# Patient Record
Sex: Female | Born: 1965
Health system: Southern US, Community
[De-identification: ages and names within clinical notes are randomized; demographics above are authoritative.]

## PROBLEM LIST (undated history)

## (undated) DIAGNOSIS — J329 Chronic sinusitis, unspecified: Secondary | ICD-10-CM

## (undated) DIAGNOSIS — D219 Benign neoplasm of connective and other soft tissue, unspecified: Secondary | ICD-10-CM

## (undated) DIAGNOSIS — T8859XA Other complications of anesthesia, initial encounter: Secondary | ICD-10-CM

## (undated) DIAGNOSIS — I10 Essential (primary) hypertension: Secondary | ICD-10-CM

## (undated) DIAGNOSIS — T4145XA Adverse effect of unspecified anesthetic, initial encounter: Secondary | ICD-10-CM

## (undated) DIAGNOSIS — Z9889 Other specified postprocedural states: Secondary | ICD-10-CM

## (undated) HISTORY — PX: MYOMECTOMY: SHX85

## (undated) HISTORY — PX: DIAGNOSTIC LAPAROSCOPY: SUR761

## (undated) HISTORY — PX: ABDOMINAL HYSTERECTOMY: SHX81

---

## 1998-06-25 ENCOUNTER — Ambulatory Visit (HOSPITAL_COMMUNITY): Admission: RE | Admit: 1998-06-25 | Discharge: 1998-06-25 | Payer: Self-pay | Admitting: Obstetrics and Gynecology

## 1999-02-28 ENCOUNTER — Other Ambulatory Visit: Admission: RE | Admit: 1999-02-28 | Discharge: 1999-02-28 | Payer: Self-pay | Admitting: Obstetrics and Gynecology

## 1999-06-01 ENCOUNTER — Other Ambulatory Visit: Admission: RE | Admit: 1999-06-01 | Discharge: 1999-06-01 | Payer: Self-pay | Admitting: Obstetrics and Gynecology

## 1999-09-26 ENCOUNTER — Other Ambulatory Visit: Admission: RE | Admit: 1999-09-26 | Discharge: 1999-09-26 | Payer: Self-pay | Admitting: Obstetrics and Gynecology

## 1999-11-22 ENCOUNTER — Other Ambulatory Visit: Admission: RE | Admit: 1999-11-22 | Discharge: 1999-11-22 | Payer: Self-pay | Admitting: Obstetrics and Gynecology

## 1999-12-19 ENCOUNTER — Encounter (INDEPENDENT_AMBULATORY_CARE_PROVIDER_SITE_OTHER): Payer: Self-pay

## 1999-12-19 ENCOUNTER — Other Ambulatory Visit: Admission: RE | Admit: 1999-12-19 | Discharge: 1999-12-19 | Payer: Self-pay | Admitting: Obstetrics and Gynecology

## 2000-02-18 ENCOUNTER — Ambulatory Visit (HOSPITAL_COMMUNITY): Admission: RE | Admit: 2000-02-18 | Discharge: 2000-02-18 | Payer: Self-pay | Admitting: Obstetrics and Gynecology

## 2000-02-18 ENCOUNTER — Encounter (INDEPENDENT_AMBULATORY_CARE_PROVIDER_SITE_OTHER): Payer: Self-pay | Admitting: Specialist

## 2000-05-23 ENCOUNTER — Other Ambulatory Visit: Admission: RE | Admit: 2000-05-23 | Discharge: 2000-05-23 | Payer: Self-pay | Admitting: Obstetrics and Gynecology

## 2000-10-16 ENCOUNTER — Other Ambulatory Visit: Admission: RE | Admit: 2000-10-16 | Discharge: 2000-10-16 | Payer: Self-pay | Admitting: Obstetrics and Gynecology

## 2000-11-23 ENCOUNTER — Ambulatory Visit (HOSPITAL_COMMUNITY): Admission: RE | Admit: 2000-11-23 | Discharge: 2000-11-23 | Payer: Self-pay | Admitting: Obstetrics and Gynecology

## 2000-11-23 ENCOUNTER — Encounter (INDEPENDENT_AMBULATORY_CARE_PROVIDER_SITE_OTHER): Payer: Self-pay

## 2001-06-01 ENCOUNTER — Inpatient Hospital Stay (HOSPITAL_COMMUNITY): Admission: AD | Admit: 2001-06-01 | Discharge: 2001-06-01 | Payer: Self-pay | Admitting: Obstetrics and Gynecology

## 2001-07-02 ENCOUNTER — Ambulatory Visit (HOSPITAL_COMMUNITY): Admission: RE | Admit: 2001-07-02 | Discharge: 2001-07-02 | Payer: Self-pay | Admitting: Obstetrics and Gynecology

## 2002-01-03 ENCOUNTER — Inpatient Hospital Stay (HOSPITAL_COMMUNITY): Admission: AD | Admit: 2002-01-03 | Discharge: 2002-01-03 | Payer: Self-pay | Admitting: Obstetrics and Gynecology

## 2002-03-05 ENCOUNTER — Other Ambulatory Visit: Admission: RE | Admit: 2002-03-05 | Discharge: 2002-03-05 | Payer: Self-pay | Admitting: Obstetrics and Gynecology

## 2002-03-06 ENCOUNTER — Encounter: Payer: Self-pay | Admitting: Obstetrics and Gynecology

## 2002-03-06 ENCOUNTER — Ambulatory Visit (HOSPITAL_COMMUNITY): Admission: RE | Admit: 2002-03-06 | Discharge: 2002-03-06 | Payer: Self-pay | Admitting: Obstetrics and Gynecology

## 2002-04-09 ENCOUNTER — Inpatient Hospital Stay (HOSPITAL_COMMUNITY): Admission: AD | Admit: 2002-04-09 | Discharge: 2002-04-09 | Payer: Self-pay | Admitting: Obstetrics and Gynecology

## 2003-04-09 ENCOUNTER — Encounter: Payer: Self-pay | Admitting: Emergency Medicine

## 2003-04-09 ENCOUNTER — Emergency Department (HOSPITAL_COMMUNITY): Admission: EM | Admit: 2003-04-09 | Discharge: 2003-04-09 | Payer: Self-pay | Admitting: Emergency Medicine

## 2003-10-21 ENCOUNTER — Other Ambulatory Visit: Admission: RE | Admit: 2003-10-21 | Discharge: 2003-10-21 | Payer: Self-pay | Admitting: Obstetrics and Gynecology

## 2004-12-08 ENCOUNTER — Ambulatory Visit (HOSPITAL_BASED_OUTPATIENT_CLINIC_OR_DEPARTMENT_OTHER): Admission: RE | Admit: 2004-12-08 | Discharge: 2004-12-08 | Payer: Self-pay | Admitting: Otolaryngology

## 2004-12-08 ENCOUNTER — Ambulatory Visit: Payer: Self-pay | Admitting: Internal Medicine

## 2005-05-29 ENCOUNTER — Ambulatory Visit (HOSPITAL_COMMUNITY): Admission: RE | Admit: 2005-05-29 | Discharge: 2005-05-29 | Payer: Self-pay | Admitting: Specialist

## 2006-05-10 ENCOUNTER — Inpatient Hospital Stay (HOSPITAL_COMMUNITY): Admission: AD | Admit: 2006-05-10 | Discharge: 2006-05-13 | Payer: Self-pay | Admitting: Obstetrics and Gynecology

## 2007-04-13 ENCOUNTER — Emergency Department (HOSPITAL_COMMUNITY): Admission: EM | Admit: 2007-04-13 | Discharge: 2007-04-13 | Payer: Self-pay | Admitting: Emergency Medicine

## 2007-04-18 ENCOUNTER — Emergency Department (HOSPITAL_COMMUNITY): Admission: EM | Admit: 2007-04-18 | Discharge: 2007-04-18 | Payer: Self-pay | Admitting: Emergency Medicine

## 2008-03-06 ENCOUNTER — Emergency Department (HOSPITAL_COMMUNITY): Admission: EM | Admit: 2008-03-06 | Discharge: 2008-03-06 | Payer: Self-pay | Admitting: Emergency Medicine

## 2009-07-20 ENCOUNTER — Inpatient Hospital Stay (HOSPITAL_COMMUNITY): Admission: RE | Admit: 2009-07-20 | Discharge: 2009-07-23 | Payer: Self-pay | Admitting: Obstetrics and Gynecology

## 2009-10-05 ENCOUNTER — Emergency Department (HOSPITAL_COMMUNITY): Admission: EM | Admit: 2009-10-05 | Discharge: 2009-10-05 | Payer: Self-pay | Admitting: Emergency Medicine

## 2009-11-25 ENCOUNTER — Emergency Department (HOSPITAL_COMMUNITY): Admission: EM | Admit: 2009-11-25 | Discharge: 2009-11-25 | Payer: Self-pay | Admitting: Family Medicine

## 2009-11-26 ENCOUNTER — Emergency Department (HOSPITAL_COMMUNITY): Admission: EM | Admit: 2009-11-26 | Discharge: 2009-11-26 | Payer: Self-pay | Admitting: Emergency Medicine

## 2010-10-09 ENCOUNTER — Encounter: Payer: Self-pay | Admitting: Obstetrics and Gynecology

## 2010-10-10 ENCOUNTER — Encounter: Payer: Self-pay | Admitting: Obstetrics and Gynecology

## 2010-10-25 ENCOUNTER — Other Ambulatory Visit (HOSPITAL_COMMUNITY): Payer: Self-pay | Admitting: Obstetrics and Gynecology

## 2010-10-25 DIAGNOSIS — Z1231 Encounter for screening mammogram for malignant neoplasm of breast: Secondary | ICD-10-CM

## 2010-11-08 ENCOUNTER — Ambulatory Visit (HOSPITAL_COMMUNITY): Payer: 59

## 2010-11-17 ENCOUNTER — Ambulatory Visit (HOSPITAL_COMMUNITY)
Admission: RE | Admit: 2010-11-17 | Discharge: 2010-11-17 | Disposition: A | Discharge status: Home / Self Care | Payer: 59 | Source: Ambulatory Visit | Attending: Obstetrics and Gynecology | Admitting: Obstetrics and Gynecology

## 2010-11-17 DIAGNOSIS — Z1231 Encounter for screening mammogram for malignant neoplasm of breast: Secondary | ICD-10-CM | POA: Insufficient documentation

## 2010-12-19 ENCOUNTER — Inpatient Hospital Stay (INDEPENDENT_AMBULATORY_CARE_PROVIDER_SITE_OTHER)
Admission: RE | Admit: 2010-12-19 | Discharge: 2010-12-19 | Disposition: A | Discharge status: Home / Self Care | Payer: 59 | Source: Ambulatory Visit | Attending: Family Medicine | Admitting: Family Medicine

## 2010-12-19 DIAGNOSIS — J069 Acute upper respiratory infection, unspecified: Secondary | ICD-10-CM

## 2010-12-21 LAB — CBC
Hemoglobin: 12.8 g/dL (ref 12.0–15.0)
MCHC: 33.1 g/dL (ref 30.0–36.0)
RBC: 4.43 MIL/uL (ref 3.87–5.11)
WBC: 11 10*3/uL — ABNORMAL HIGH (ref 4.0–10.5)

## 2010-12-21 LAB — CCBB MATERNAL DONOR DRAW

## 2010-12-22 LAB — CBC
HCT: 40.6 % (ref 36.0–46.0)
Hemoglobin: 13.6 g/dL (ref 12.0–15.0)
MCV: 85.7 fL (ref 78.0–100.0)
Platelets: 142 10*3/uL — ABNORMAL LOW (ref 150–400)
WBC: 7.9 10*3/uL (ref 4.0–10.5)

## 2010-12-29 ENCOUNTER — Other Ambulatory Visit (HOSPITAL_COMMUNITY): Payer: Self-pay | Admitting: Internal Medicine

## 2010-12-29 DIAGNOSIS — R51 Headache: Secondary | ICD-10-CM

## 2010-12-29 DIAGNOSIS — R110 Nausea: Secondary | ICD-10-CM

## 2010-12-30 ENCOUNTER — Ambulatory Visit (HOSPITAL_COMMUNITY)
Admission: RE | Admit: 2010-12-30 | Discharge: 2010-12-30 | Disposition: A | Discharge status: Home / Self Care | Payer: 59 | Source: Ambulatory Visit | Attending: Internal Medicine | Admitting: Internal Medicine

## 2010-12-30 DIAGNOSIS — R51 Headache: Secondary | ICD-10-CM | POA: Insufficient documentation

## 2010-12-30 DIAGNOSIS — I10 Essential (primary) hypertension: Secondary | ICD-10-CM | POA: Insufficient documentation

## 2010-12-30 DIAGNOSIS — R110 Nausea: Secondary | ICD-10-CM

## 2011-02-03 NOTE — Procedures (Signed)
NAME:  Erika Tate, Erika Tate            ACCOUNT NO.:  [id number]   MEDICAL RECORD NO.:  [id number]          PATIENT TYPE:  OUT   LOCATION:  SLEEP CENTER                 FACILITY:  [facility]   PHYSICIAN:  Clinton D. Maple Hudson, M.D. DATE OF BIRTH:  March 20, 1966   DATE OF STUDY:  12/08/2004                              NOCTURNAL POLYSOMNOGRAM   REFERRING PHYSICIAN:  Dr. Dillard Cannon.   INDICATION FOR STUDY:  Hypersomnia with sleep apnea. Epworth sleepiness  score 10/24. BMI 29, weight 160 pounds.   SLEEP ARCHITECTURE:  Total sleep time 366 minutes with sleep efficiency 85%.  Stage 1 was 6%, stage 2 73%, stages 3 and 4 were 5%, REM was 16% of total  sleep time. Latency to sleep onset 38 minutes, latency to REM 275 minutes,  awake after sleep onset 28 minutes, arousal index 18.9.   RESPIRATORY DATA:  Respiratory disturbance index (RDI) 3 obstructive events  per hour which is within normal limits. There was one obstructive apnea with  9 hypopneas. Events were positional with most being recorded while she was  sleeping supine.   OXYGEN DATA:  Mild snoring with oxygen desaturation to a nadir of 95%. Mean  oxygen saturation through the study was 98% on room air.   CARDIAC DATA:  Normal sinus rhythm.   MOVEMENT/PARASOMNIA:  A total of 58 limb jerks were recorded, of which six  were associated with arousal or awakening for a periodic limb movement with  arousal index of one per hour which is unremarkable.   IMPRESSION/RECOMMENDATION:  Occasional sleep disordered breathing events,  within normal limits, respiratory disturbance index 3 per hour, with no  specific therapy suggested.      CDY/MEDQ  D:  12/11/2004 13:09:49  T:  12/12/2004 08:34:31  Job:  [id number]

## 2011-02-03 NOTE — Op Note (Signed)
[facility]  Patient:    Erika Tate, Erika Tate                   MRN: [id number] Proc. Date: 02/18/00 Adm. Date:  [id number] Disc. 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  OPERATION:                    Loupe electrical excision.  SURGEON:                      Janine Limbo, M.D.  ASSISTANT:  ANESTHESIA:                   Local Xylocaine and Marcaine.  ESTIMATED BLOOD LOSS:  INDICATIONS:                  [name] is a 45 year old who presents with CIN-3 on the exocervix and questionable dysplasia involving the endocervical field as well. She has had a conization in the past for CIN-3.  The patient understands the indications for her procedure and she accepts the risks of, but not limited to,  anesthetic complications, bleeding, infections, and possible damage to the surrounding organs.  FINDINGS:                     The patient was noted to have some white epithelial changes around the transition zone.  DESCRIPTION OF PROCEDURE:     The patient was taken to the operating room where she was placed in a lithotomy position.  The vagina was prepped with multiple layers of Betadine.  Examination under anesthesia was performed.  The cervix was injected  with a diluted solution of Xylocaine, Marcaine, Pitressin, and normal saline. Colposcopy was performed and the white epithelial lesions were visualized.  The  LEEP machine was then used to perform a conization procedure.  The patient tolerated her procedure well.  The ball electrode was then used to obtain hemostasis within the defect of the cervix, but also to ablate the periphery of the conization specimen.  The estimated blood loss  was 5 cc.  The patient tolerated the procedure well.  She was returned to the supine position and then taken to the recovery room in stable condition.  FOLLOW-UP:                    The patient was given Tylenol No.3 one to two p.o. q.4h. p.r.n. pain.  She will return to see Janine Limbo, M.D. in two to three weeks for follow-up examination.  She was given a copy of the postoperative instruction sheet as prepared by [facility] for patients who  have undergone a D&C, but that was modified for a LEEP procedure. DD:  02/18/00 TD:  02/21/00 Job: [id number]

## 2011-02-03 NOTE — Op Note (Signed)
[facility]  Patient:    Erika Tate, Erika Tate                   MRN: [id number] Adm. Date:  [id number] Disc. Date: [id number] Attending:  Leonard Tate                           Operative Report  PREOPERATIVE DIAGNOSES:       1. Dysmenorrhea.                               2. Fibroids.  POSTOPERATIVE DIAGNOSES:      1. Dysmenorrhea.                               2. Fibroids.                               3. Endometriosis.                               4. Cervical stenosis.  PROCEDURE:                    Diagnostic laparoscopy with laparoscopic pelvic                               biopsies.  SURGEON:                      Erika Tate, M.D.  ANESTHESIA:                   General.  ESTIMATED BLOOD LOSS:         10 cc.  DISPOSITION:                  Erika Tate is a 45 year old female, gravida 0, who presents with dysmenorrhea and known fibroids. The patient understands the indications for her surgical procedure and she accepts the risk of, but not limited to, anesthetic complications, bleeding, infections, and possible damage to the surrounding organs.  FINDINGS:                     The uterus was noted to be 12-14 week size and it contained a large fibroid in the fundus of the uterus. There was a 3.5 x 3.5 cm pedunculated myoma arising from the left fundus. The fallopian tubes appeared completely normal. The ovaries were normal except for signs of endometriosis. There were "powder burns" in the posterior cul-de-sac to the right and to the left of the midline. No nodules were present, however. There were hyperpigmented areas on both ovaries consistent with endometriosis. All endometriosis lesions measured less than 0.5 cm. There were "powder burns" of endometriosis present in the anterior cul-de-sac. Again, no nodularity was noted. The appendix appeared normal. The upper abdomen appeared normal. The bowel appeared normal.  DESCRIPTION  OF PROCEDURE:     The patient was taken to the operating room where a general anesthetic was given. The patients abdomen, perineum, and vagina were prepped with multiple layers of Betadine. A Foley catheter was placed in the bladder. The patient then had an examination under anesthesia. Attempts  were made to insert the Acorn cannula so that we could perform a chromopertubation. The patients cervix was stenotic and the cannula would not fit. We then received the small dilators and we were unable to dilate the cervix to the point that we could insert either the Acorn cannula or the Hulka tenaculum. We therefore put a single-tooth tenaculum on the cervix. The patient was then sterilely draped. The subumbilical area was injected with 5 cc of 0.5% Marcaine. An incision was made and the Veress needle was inserted without difficulty. Proper placement was confirmed using the saline drop test. A pneumoperitoneum was then obtained. The laparoscopic trocar and then the laparoscope were substituted for the Veress needle. The pelvic structures were visualized with findings as mentioned above. The suprapubic area was then injected with 0.5% Marcaine and an incision was made. A 5 mm trocar was placed in the lower abdomen. Pictures were taken of the patients pelvic anatomy documenting the findings mentioned above. We then injected several areas in the posterior cul-de-sac with saline. Biopsies were obtained to document endometriosis. Visualization was very difficult because of the patients large uterus and because of the pedunculated fibroids. Because of the extensive nature of the endometriosis, the decision was made to treat the patient with Lupron Depot. Hemostasis was noted to be adequate throughout. The pneumoperitoneum was allowed to escape. The bowel was inspected and there was no evidence of damage to the bowel from the trocars. The incisions were closed using 4-0 Vicryl. Sponge, needle, and  instrument counts were correct on two occasions. The estimated blood loss for the procedure was 10 cc. The patient tolerated her procedure well. She was awakened from her anesthetic and taken to the recovery room in stable condition.  FOLLOWUP INSTRUCTIONS:        The patient was given Vicodin one to two p.o. q.4h. p.r.n. pain. She will return to see Erika Tate in two to three weeks for followup examination. She was given a copy of the postoperative instruction sheet as prepared by the [facility] for patients who have undergone laparoscopy. DD:  11/24/00 TD:  11/25/00 Job: [id number]

## 2011-02-03 NOTE — Discharge Summary (Signed)
NAME:  Erika Tate, DOWERS            ACCOUNT NO.:  [id number]   MEDICAL RECORD NO.:  [id number]          PATIENT TYPE:  INP   LOCATION:  9131                          FACILITY:  [facility]   PHYSICIAN:  Maxie Better, M.D.DATE OF BIRTH:  29-Jan-1966   DATE OF ADMISSION:  05/10/2006  DATE OF DISCHARGE:  05/13/2006                                 DISCHARGE SUMMARY   ADMISSION DIAGNOSES:  Term gestation, previous myomectomy, cervical  incompetence, abdominal cerclage.   DISCHARGE DIAGNOSES:  Term gestation, delivered.  Previous myomectomy,  abdominal cerclage.   PROCEDURE:  Primary cesarean section.   HOSPITAL COURSE:  The patient was admitted to [facility] as a 45-year-  old G1, P0 female at term for primary cesarean section secondary to previous  myomectomy and abdominal cerclage.  The patient underwent an uncomplicated  primary cesarean section.  The procedure resulted in a delivery of a live  female, cord around the neck x3, weighing 6 pounds, 7 ounces.  Postoperatively, the patient did well.  CBC on postop day #1 showed a  hemoglobin of 12.4, hematocrit 36.8, platelet count 131,000.  Her admission  platelet count was 134,000.  This is probably due to gestational  thrombocytopenia.   The patient has tolerated a regular diet, is passing flatus.  Her incision  has some staples; however, it has not induration, erythema, or exudate.  She  was deemed well to be discharged home.   DISPOSITION:  Home.   CONDITION:  Stable.   DISCHARGE MEDICATIONS:  Tylox 1-2 tablets every 3-4 hours p.r.n. pain.  Motrin 800 mg 1 p.o. q.6-8h. p.r.n. pain.  Prenatal vitamins 1 p.o. daily.   FOLLOW-UP APPOINTMENTS:  At [facility] on Thursday for staple removal  and in six weeks for a postpartum visit.   DISCHARGE INSTRUCTIONS:  Per the postpartum booklet given.      Maxie Better, M.D.  Electronically Signed     [place]/MEDQ  D:  05/13/2006  T:  05/13/2006  Job:  [id number]

## 2011-02-03 NOTE — H&P (Signed)
[facility]  Patient:    Erika Tate, Erika Tate                     MRN: [id number] Adm. Date:  02/18/00 Attending:  Janine Limbo, M.D.                         History and Physical  HISTORY OF PRESENT ILLNESS:       The patient is a 45 year old female, para 0, who presents for a LEEP conization of the cervix because of CIN-3.  Her endocervical curettage had a fragment of dysplastic squamous mucosa.  The patient was treated as early as 1991 for CIN-3 at which time she had a conization of the cervix.  PAST MEDICAL HISTORY:             The patient has a past history of asthma, and she uses an inhaler and Flonase as needed.  She takes Claritin D for congestion.  She is taking Zoloft for premenstrual syndrome.  The patient has a past history of trichomoniasis and chlamydia.  DRUG ALLERGIES:                   ASPIRIN causes gastric upset.  SOCIAL HISTORY:                   The patient drinks alcohol socially.  She denies cigarette use and recreational drug use.  REVIEW OF SYSTEMS:                The patient has headaches, family problems, fatigue, crying spells, and she wears glasses.  FAMILY HISTORY:                   The patients grandparents have diabetes, hypertension, heart disease, stroke, and cancer.  PHYSICAL EXAMINATION:  VITAL SIGNS:                      Weight is 134 pounds.  HEENT:                            Within normal limits.  CHEST:                            Clear.  HEART:                            Regular rate and rhythm.  There is a grade 1/6 systolic ejection murmur present.  ABDOMEN:                          Nontender.  EXTREMITIES:                      Within normal limits  NEUROLOGIC:                       Grossly normal.  BREASTS:                          Without masses.  PELVIC:                           External genitalia is normal.  The vagina is normal.  The  cervix is only slightly scarred.  The uterus is  normal size, shape, and consistency.  Adnexa: No masses.  ASSESSMENT:                       CIN-3 with dysplastic epithelium on the endocervical curettage.  PLAN:                             The patient will undergo a LEEP procedure of the cervix.  She understands the indications for her procedure, and she accepts the risks of, but not limited to, anesthetic complications, bleeding, infection, and possible damage to surrounding organs. DD:  02/17/00 TD:  02/17/00 Job: [id number]

## 2011-02-03 NOTE — Op Note (Signed)
[facility]  Patient:    Erika Tate, Erika Tate Visit Number: [id number] MRN: [id number]          Service Type: DSU Location: [facility] Attending Physician:  Maxie Better Proc. Date: 07/02/01 Admit Date:  07/02/2001                             Operative Report  PREOPERATIVE DIAGNOSIS:       Severe cervical stenosis.  POSTOPERATIVE DIAGNOSIS:      Severe cervical stenosis, uterine fibroids.  OPERATION:                    Cervical dilation.  SURGEON:                      Sheronette A. Cherly Hensen, M.D.  ASSISTANT:  ANESTHESIA:                   General, paracervical block.  ESTIMATED BLOOD LOSS:         Minimal.  COMPLICATIONS:                None.  INDICATIONS:                  This is a 45 year old G0 female, last menstrual period 06/24/01, who is status post conization and LEEP procedure with now severe cervical stenosis and severe associated dysmenorrhea, who now presents for cervical dilatation.  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  Her cervical os would not allow for an acorn cannula at the time of her laparoscopy due to its severe stenotic state.  Paracervical block and attempt at cervical dilation in the outpatient setting was unsuccessful, and the patient now presents for the procedure under general anesthesia.  Risks of the procedure, including bleeding, inability to complete the procedure, uterine perforation were discussed with the patient and her husband, consent was signed.  The patient was transferred to the operating room.  DESCRIPTION OF PROCEDURE:     Under adequate general anesthesia, the patient was placed in the dorsal lithotomy position.  The examination under anesthesia revealed anteflexed uterus, deviated to the left, with about a 5 tr 6 cm subserosal fibroid off of the right body of the uterus.  There is also a palpable  posterior cul-de-sac mass that appears to be mobile on examination. The patient was then thoroughly prepped and draped in the usual fashion. The bladder was not catheterized, in the event that ultrasound guidance would be necessary.  A bivalve speculum was placed in the vagina.  The cervix was flushed to the anterior posterior wall of the vagina.  Ten cc of 0.25% Marcaine was injected paracervically at 3 and 9 oclock.  The cervix was then grasped with a single tooth tenaculum and traction was placed.  About 1.5 cm of cervical length was then noted.  The cervical os was identified as a pinpoint opening.  The lacrimal probes all the way up to #8 were then utilized when the tract was identified.  The #9-10 Pratt dilator could not traverse the external os at that point, and therefore a plastic cervical dilator was then used to traverse the external os, and then the [place] dilators were then able to be utilized all the way up to a [employer] dilator.  During this process, dark menstrual-appearing type blood was traversing  the os, and when the 31 Pratt dilator was reached, this was put in place and left for several minutes. The procedure was then terminated by removing all instruments from the vagina.  SPECIMENS:                    None.  The patient tolerated the procedure well and was transferred to the recovery room in stable condition. Attending Physician:  Maxie Better DD:  07/02/01 TD:  07/02/01 Job: [phone] [id number]

## 2011-02-03 NOTE — H&P (Signed)
[facility]  Patient:    Erika Tate, Erika Tate                     MRN: [id number] Adm. Date:  11/23/00 Attending:  Janine Limbo, M.D.                         History and Physical  HISTORY OF PRESENT ILLNESS:   The patient is a 45 year old female, gravida 0, who presents for a diagnostic laparoscopy because of dysmenorrhea and fibroids.  The patient had a LEEP in June of 2001.  The patient has a history of trichomoniasis and a history of Chlamydia.  PAST MEDICAL HISTORY:         The patient has a history of asthma and she uses an inhaler and Flonase as needed.  She takes Claritin-D for congestion.  She is taking Zoloft for premenstrual syndrome.  DRUG ALLERGIES:               Aspirin causes gastric upset.  SOCIAL HISTORY:               The patient drinks alcohol socially.  She denies cigarette use and recreational drug use.  REVIEW OF SYSTEMS:            The patient has a history of headaches, family problems, fatigue, crying spells and she wears glasses.  FAMILY HISTORY:               The patients grandparents have diabetes, hypertension, heart disease, stroke and cancer.  PHYSICAL EXAMINATION  VITAL SIGNS:                  Weight is 134 pounds.  HEENT:                        Within normal limits.  CHEST:                        Clear.  HEART:                        Regular rate and rhythm.  There is a grade 1/6 systolic ejection murmur present.  ABDOMEN:                      Nontender.  EXTREMITIES:                  Within normal limits.  NEUROLOGIC:                   Grossly normal.  PELVIC:                       External genitalia is normal.  The vagina is normal.  Cervix is nontender.  The uterus is 10 to 12 weeks size and irregular.  Adnexa:  No masses.  ASSESSMENT:                   1. Fibroid uterus.                               2. Dysmenorrhea.  PLAN:                         The patient will undergo diagnostic  laparoscopy. She understands the indications for her procedure and she accepts the risks of, but not limited to, anesthetic complications, bleeding, infections and possible damage to the surrounding organs.  The patient has taken oral contraceptives without relief of her dysmenorrhea.  DD:  11/22/00 TD:  11/23/00 Job: [id number]

## 2011-02-03 NOTE — Op Note (Signed)
NAME:  Erika Tate, Erika Tate            ACCOUNT NO.:  [id number]   MEDICAL RECORD NO.:  [id number]          PATIENT TYPE:  INP   LOCATION:  9131                          FACILITY:  [facility]   PHYSICIAN:  Maxie Better, M.D.DATE OF BIRTH:  11-22-1965   DATE OF PROCEDURE:  05/10/2006  DATE OF DISCHARGE:                                 OPERATIVE REPORT   PREOPERATIVE DIAGNOSIS:  Term gestation, previous myomectomy, abdominal  cerclage.   PROCEDURE:  A primary cesarean section.   POSTOPERATIVE DIAGNOSIS:  Previous myomectomy.  Term gestation, abdominal  cerclage.   ANESTHESIA:  Spinal.   SURGEON:  Maxie Better, M.D.   ASSISTANT:  Olivia Mackie, MD   PROCEDURE:  Under adequate spinal anesthesia, the patient was placed in the  supine position with a left lateral tilt.  She was sterilely prepped and  draped in usual fashion.  Indwelling Foley catheter sterilely placed.  10 mL  of quarter percent Marcaine was injected on the previous incision.  Pfannenstiel skin incision was then made, carried down to the rectus fascia.  Rectus fascia was opened transversely.  Rectus fascia was then carefully  sharply dissected off the rectus muscles, superior and inferior fashion.  The rectus muscle was sharply separated.  The parietal peritoneum was  entered.  Adhesions were noted, particularly in the lower uterine segment.  After a doing lysis of adhesions, the bladder flap was not easily  ascertained, therefore a transverse incision was then made and extended  bilaterally.  Artificial rupture of membranes was performed.  Copious clear  fluid was noted.  Subsequent delivery using a vacuum was then accomplished  and resulted in delivery of a live female cord around the neck x3 which was  easily reduced.  Cord was clamped, cut, the baby was transferred to the  awaiting pediatricians who assigned Apgars of 8 and 9 at one and five  minutes.  The placenta was manually removed from its anterior  location,  uterine cavity was cleaned of debris.  The abdominal cerclage was not  opened.  The uterine cavity was cleaned of debris.  Uterine incision was  then closed in two-layers, the first layer zero Monocryl running locked  stitch.  Second layer was imbricated using zero Monocryl figure-of-eight for  hemostasis up done on the left.  Upper abdomen was explored, adhesions were  noted that were lysed.  Normal tubes and ovaries were noted bilaterally.  The abdomen was copiously irrigated.  The parietal peritoneum was not  closed.  The rectus fascia was closed with zero Vicryl x2.  The subcutaneous  areas irrigated, small bleeders cauterized.  Interrupted 2-0 plain sutures  were then placed.  The skin was approximated with Ethicon staples.  Specimen  was placenta not sent to pathology.  Estimated blood loss was a  liter.  Urine output was 20 mL clear yellow urine.  Intraoperative fluid was  3100 mL crystalloid.  Sponge and instrument counts x2 was correct.  Complication was none.  The patient tolerated the procedure well, was  transferred to recovery in stable condition.  Weight of the baby was 6  pounds 7 ounces.  Maxie Better, M.D.  Electronically Signed     [place]/MEDQ  D:  05/10/2006  T:  05/11/2006  Job:  [id number]

## 2011-03-03 ENCOUNTER — Inpatient Hospital Stay (INDEPENDENT_AMBULATORY_CARE_PROVIDER_SITE_OTHER)
Admission: RE | Admit: 2011-03-03 | Discharge: 2011-03-03 | Disposition: A | Discharge status: Home / Self Care | Payer: 59 | Source: Ambulatory Visit | Attending: Family Medicine | Admitting: Family Medicine

## 2011-03-03 DIAGNOSIS — J40 Bronchitis, not specified as acute or chronic: Secondary | ICD-10-CM

## 2011-04-05 ENCOUNTER — Inpatient Hospital Stay (INDEPENDENT_AMBULATORY_CARE_PROVIDER_SITE_OTHER)
Admission: RE | Admit: 2011-04-05 | Discharge: 2011-04-05 | Disposition: A | Discharge status: Home / Self Care | Payer: 59 | Source: Ambulatory Visit | Attending: Emergency Medicine | Admitting: Emergency Medicine

## 2011-04-05 ENCOUNTER — Emergency Department (HOSPITAL_COMMUNITY)
Admission: EM | Admit: 2011-04-05 | Discharge: 2011-04-05 | Disposition: A | Discharge status: Home / Self Care | Payer: 59 | Attending: Emergency Medicine | Admitting: Emergency Medicine

## 2011-04-05 ENCOUNTER — Emergency Department (HOSPITAL_COMMUNITY): Payer: 59

## 2011-04-05 ENCOUNTER — Encounter (HOSPITAL_COMMUNITY): Payer: Self-pay

## 2011-04-05 DIAGNOSIS — R10814 Left lower quadrant abdominal tenderness: Secondary | ICD-10-CM

## 2011-04-05 DIAGNOSIS — M545 Low back pain, unspecified: Secondary | ICD-10-CM | POA: Insufficient documentation

## 2011-04-05 DIAGNOSIS — I10 Essential (primary) hypertension: Secondary | ICD-10-CM | POA: Insufficient documentation

## 2011-04-05 DIAGNOSIS — Z8742 Personal history of other diseases of the female genital tract: Secondary | ICD-10-CM | POA: Insufficient documentation

## 2011-04-05 DIAGNOSIS — N949 Unspecified condition associated with female genital organs and menstrual cycle: Secondary | ICD-10-CM | POA: Insufficient documentation

## 2011-04-05 DIAGNOSIS — R112 Nausea with vomiting, unspecified: Secondary | ICD-10-CM | POA: Insufficient documentation

## 2011-04-05 DIAGNOSIS — E876 Hypokalemia: Secondary | ICD-10-CM

## 2011-04-05 DIAGNOSIS — K5900 Constipation, unspecified: Secondary | ICD-10-CM | POA: Insufficient documentation

## 2011-04-05 DIAGNOSIS — R109 Unspecified abdominal pain: Secondary | ICD-10-CM | POA: Insufficient documentation

## 2011-04-05 HISTORY — DX: Benign neoplasm of connective and other soft tissue, unspecified: D21.9

## 2011-04-05 HISTORY — DX: Other specified postprocedural states: Z98.890

## 2011-04-05 HISTORY — DX: Essential (primary) hypertension: I10

## 2011-04-05 LAB — POCT URINALYSIS DIP (DEVICE)
Ketones, ur: >=160 mg/dL — AB
Protein, ur: >=300 mg/dL — AB
Specific Gravity, Urine: >=1.03 (ref 1.005–1.030)
pH: 5.5 (ref 5.0–8.0)

## 2011-04-05 LAB — OCCULT BLOOD, POC DEVICE: Fecal Occult Bld: NEGATIVE

## 2011-04-05 LAB — COMPREHENSIVE METABOLIC PANEL
ALT: 11 U/L (ref 0–35)
AST: 16 U/L (ref 0–37)
Alkaline Phosphatase: 54 U/L (ref 39–117)
CO2: 26 mEq/L (ref 19–32)
Calcium: 9.2 mg/dL (ref 8.4–10.5)
GFR calc non Af Amer: >60 mL/min (ref >60)
Glucose, Bld: 99 mg/dL (ref 70–99)
Potassium: 3.3 mEq/L — ABNORMAL LOW (ref 3.5–5.1)
Sodium: 139 mEq/L (ref 135–145)
Total Protein: 8.1 g/dL (ref 6.0–8.3)

## 2011-04-05 LAB — POCT I-STAT, CHEM 8
BUN: 9 mg/dL (ref 6–23)
Calcium, Ion: 1.08 mmol/L — ABNORMAL LOW (ref 1.12–1.32)
Chloride: 106 mEq/L (ref 96–112)
Creatinine, Ser: 0.7 mg/dL (ref 0.50–1.10)
Glucose, Bld: 102 mg/dL — ABNORMAL HIGH (ref 70–99)
HCT: 52 % — ABNORMAL HIGH (ref 36.0–46.0)
Potassium: 2.9 mEq/L — ABNORMAL LOW (ref 3.5–5.1)

## 2011-04-05 LAB — CBC
HCT: 44.6 % (ref 36.0–46.0)
Hemoglobin: 15.5 g/dL — ABNORMAL HIGH (ref 12.0–15.0)
MCHC: 34.8 g/dL (ref 30.0–36.0)
WBC: 8.2 10*3/uL (ref 4.0–10.5)

## 2011-04-05 LAB — WET PREP, GENITAL: WBC, Wet Prep HPF POC: NONE SEEN

## 2011-04-05 MED ORDER — IOHEXOL 300 MG/ML  SOLN
80.0000 mL | Freq: Once | INTRAMUSCULAR | Status: AC | PRN
  Administered 2011-04-05: 80 mL via INTRAVENOUS

## 2011-06-15 LAB — POCT RAPID STREP A: Streptococcus, Group A Screen (Direct): NEGATIVE

## 2011-07-03 LAB — POCT RAPID STREP A: Streptococcus, Group A Screen (Direct): NEGATIVE

## 2011-08-23 ENCOUNTER — Other Ambulatory Visit: Payer: Self-pay | Admitting: Obstetrics and Gynecology

## 2011-08-24 ENCOUNTER — Encounter (HOSPITAL_COMMUNITY): Payer: Self-pay | Admitting: Pharmacist

## 2011-08-28 NOTE — Patient Instructions (Addendum)
   Your procedure is scheduled on: Friday, Dec 14th  Enter through the [employer] [facility] at: 11:30am Pick up the phone at the desk and dial [phone] and inform us of your arrival.  Please call this number if you have any problems the morning of surgery: [phone]  Remember: Do not eat food after midnight: Thursday Do not drink clear liquids after: 9am Friday Take these medicines the morning of surgery with a SIP OF WATER: none  Do not wear jewelry, make-up, or FINGER nail polish Do not wear lotions, powders, perfumes or deodorant. Do not shave 48 hours prior to surgery. Do not bring valuables to the hospital.  Leave suitcase in the car. After Surgery it may be brought to your room. For patients being admitted to the hospital, checkout time is 11:00am the day of discharge.  Patients discharged on the day of surgery will not be allowed to drive home.     Remember to use your hibiclens as instructed.Please shower with 1/2 bottle the evening before your surgery and the other 1/2 bottle the morning of surgery.  YOUR PROCEDURE IS SCHEDULED ON:  ENTER THROUGH THE MAIN ENTRANCE OF [facility] AT:  USE DESK PHONE AND DIAL [phone] TO INFORM us OF YOUR ARRIVAL  CALL [phone] IF YOU HAVE ANY QUESTIONS OR PROBLEMS PRIOR TO YOUR ARRIVAL.  REMEMBER: DO NOT EAT OR DRINK AFTER MIDNIGHT :  SPECIAL INSTRUCTIONS:   YOU MAY BRUSH YOUR TEETH THE MORNING OF SURGERY   TAKE THESE MEDICINES THE DAY OF SURGERY WITH SIP OF WATER:   DO NOT WEAR JEWELRY, EYE MAKEUP, LIPSTICK OR DARK FINGERNAIL POLISH DO NOT WEAR LOTIONS OR DEODORANT DO NOT SHAVE FOR 48 HOURS PRIOR TO SURGERY  YOU WILL NOT BE ALLOWED TO DRIVE YOURSELF HOME.  NAME OF DRIVER: Mikey [employer]

## 2011-08-30 ENCOUNTER — Encounter (HOSPITAL_COMMUNITY): Payer: Self-pay

## 2011-08-30 ENCOUNTER — Encounter (HOSPITAL_COMMUNITY)
Admission: RE | Admit: 2011-08-30 | Discharge: 2011-08-30 | Disposition: A | Discharge status: Home / Self Care | Payer: 59 | Source: Ambulatory Visit | Attending: Obstetrics and Gynecology | Admitting: Obstetrics and Gynecology

## 2011-08-30 HISTORY — DX: Adverse effect of unspecified anesthetic, initial encounter: T41.45XA

## 2011-08-30 HISTORY — DX: Other complications of anesthesia, initial encounter: T88.59XA

## 2011-08-30 LAB — BASIC METABOLIC PANEL
CO2: 26 mEq/L (ref 19–32)
Calcium: 9.7 mg/dL (ref 8.4–10.5)
Creatinine, Ser: 0.82 mg/dL (ref 0.50–1.10)
GFR calc Af Amer: >90 mL/min (ref >90)

## 2011-08-30 LAB — CBC
MCV: 83.2 fL (ref 78.0–100.0)
Platelets: 198 10*3/uL (ref 150–400)
RDW: 14.1 % (ref 11.5–15.5)
WBC: 6.6 10*3/uL (ref 4.0–10.5)

## 2011-08-30 LAB — SURGICAL PCR SCREEN
MRSA, PCR: NEGATIVE
Staphylococcus aureus: NEGATIVE

## 2011-08-31 MED ORDER — CEFAZOLIN SODIUM-DEXTROSE 2-3 GM-% IV SOLR
2.0000 g | INTRAVENOUS | Status: AC
  Administered 2011-09-01: 2 g via INTRAVENOUS
  Filled 2011-08-31: qty 50

## 2011-09-01 ENCOUNTER — Ambulatory Visit (HOSPITAL_COMMUNITY): Payer: 59 | Admitting: Anesthesiology

## 2011-09-01 ENCOUNTER — Encounter (HOSPITAL_COMMUNITY)
Admission: RE | Disposition: A | Discharge status: Home / Self Care | Payer: Self-pay | Source: Ambulatory Visit | Attending: Obstetrics and Gynecology

## 2011-09-01 ENCOUNTER — Encounter (HOSPITAL_COMMUNITY): Payer: Self-pay | Admitting: Anesthesiology

## 2011-09-01 ENCOUNTER — Encounter (HOSPITAL_COMMUNITY): Payer: Self-pay | Admitting: *Deleted

## 2011-09-01 ENCOUNTER — Other Ambulatory Visit: Payer: Self-pay

## 2011-09-01 ENCOUNTER — Other Ambulatory Visit: Payer: Self-pay | Admitting: Obstetrics and Gynecology

## 2011-09-01 ENCOUNTER — Ambulatory Visit (HOSPITAL_COMMUNITY)
Admission: RE | Admit: 2011-09-01 | Discharge: 2011-09-02 | Disposition: A | Discharge status: Home / Self Care | Payer: 59 | Source: Ambulatory Visit | Attending: Obstetrics and Gynecology | Admitting: Obstetrics and Gynecology

## 2011-09-01 DIAGNOSIS — N946 Dysmenorrhea, unspecified: Secondary | ICD-10-CM | POA: Insufficient documentation

## 2011-09-01 DIAGNOSIS — D251 Intramural leiomyoma of uterus: Secondary | ICD-10-CM | POA: Insufficient documentation

## 2011-09-01 DIAGNOSIS — N920 Excessive and frequent menstruation with regular cycle: Secondary | ICD-10-CM | POA: Insufficient documentation

## 2011-09-01 DIAGNOSIS — N736 Female pelvic peritoneal adhesions (postinfective): Secondary | ICD-10-CM | POA: Insufficient documentation

## 2011-09-01 DIAGNOSIS — N838 Other noninflammatory disorders of ovary, fallopian tube and broad ligament: Secondary | ICD-10-CM | POA: Insufficient documentation

## 2011-09-01 DIAGNOSIS — N949 Unspecified condition associated with female genital organs and menstrual cycle: Secondary | ICD-10-CM | POA: Insufficient documentation

## 2011-09-01 DIAGNOSIS — Z90710 Acquired absence of both cervix and uterus: Secondary | ICD-10-CM

## 2011-09-01 DIAGNOSIS — Z01818 Encounter for other preprocedural examination: Secondary | ICD-10-CM | POA: Insufficient documentation

## 2011-09-01 DIAGNOSIS — Z01812 Encounter for preprocedural laboratory examination: Secondary | ICD-10-CM | POA: Insufficient documentation

## 2011-09-01 SURGERY — SALPINGECTOMY, BILATERAL, OPEN
Anesthesia: General | Site: Uterus | Wound class: Clean Contaminated

## 2011-09-01 MED ORDER — LIDOCAINE HCL (CARDIAC) 20 MG/ML IV SOLN
INTRAVENOUS | Status: AC
  Filled 2011-09-01: qty 5

## 2011-09-01 MED ORDER — LACTATED RINGERS IV SOLN
INTRAVENOUS | Status: DC
  Administered 2011-09-01 (×3): via INTRAVENOUS

## 2011-09-01 MED ORDER — PANTOPRAZOLE SODIUM 40 MG PO TBEC
40.0000 mg | DELAYED_RELEASE_TABLET | Freq: Every day | ORAL | Status: DC
  Administered 2011-09-01: 40 mg via ORAL
  Filled 2011-09-01 (×2): qty 1

## 2011-09-01 MED ORDER — HETASTARCH-ELECTROLYTES 6 % IV SOLN
INTRAVENOUS | Status: DC | PRN
  Administered 2011-09-01: 16:00:00 via INTRAVENOUS

## 2011-09-01 MED ORDER — PROPOFOL 10 MG/ML IV EMUL
INTRAVENOUS | Status: DC | PRN
  Administered 2011-09-01: 50 mg via INTRAVENOUS
  Administered 2011-09-01: 70 mg via INTRAVENOUS
  Administered 2011-09-01: 130 mg via INTRAVENOUS

## 2011-09-01 MED ORDER — HYDROMORPHONE HCL PF 1 MG/ML IJ SOLN: 0.2000 mg | INTRAMUSCULAR | Status: DC | PRN

## 2011-09-01 MED ORDER — LACTATED RINGERS IV SOLN
INTRAVENOUS | Status: DC
  Administered 2011-09-02: 01:00:00 via INTRAVENOUS

## 2011-09-01 MED ORDER — MIDAZOLAM HCL 2 MG/2ML IJ SOLN
INTRAMUSCULAR | Status: AC
  Filled 2011-09-01: qty 2

## 2011-09-01 MED ORDER — ROCURONIUM BROMIDE 50 MG/5ML IV SOLN
INTRAVENOUS | Status: AC
  Filled 2011-09-01: qty 1

## 2011-09-01 MED ORDER — GLYCOPYRROLATE 0.2 MG/ML IJ SOLN
INTRAMUSCULAR | Status: AC
  Filled 2011-09-01: qty 2

## 2011-09-01 MED ORDER — BUPIVACAINE HCL (PF) 0.25 % IJ SOLN
INTRAMUSCULAR | Status: DC | PRN
  Administered 2011-09-01: 10 mL

## 2011-09-01 MED ORDER — DIPHENHYDRAMINE HCL 12.5 MG/5ML PO ELIX
12.5000 mg | ORAL_SOLUTION | Freq: Four times a day (QID) | ORAL | Status: DC | PRN
  Filled 2011-09-01: qty 5

## 2011-09-01 MED ORDER — NEOSTIGMINE METHYLSULFATE 1 MG/ML IJ SOLN
INTRAMUSCULAR | Status: DC | PRN
  Administered 2011-09-01: 2 mg via INTRAVENOUS

## 2011-09-01 MED ORDER — MIDAZOLAM HCL 5 MG/5ML IJ SOLN
INTRAMUSCULAR | Status: DC | PRN
  Administered 2011-09-01: 2 mg via INTRAVENOUS

## 2011-09-01 MED ORDER — SUFENTANIL CITRATE 50 MCG/ML IV SOLN
INTRAVENOUS | Status: AC
  Filled 2011-09-01: qty 1

## 2011-09-01 MED ORDER — KETOROLAC TROMETHAMINE 30 MG/ML IJ SOLN
30.0000 mg | Freq: Four times a day (QID) | INTRAMUSCULAR | Status: DC
  Administered 2011-09-01 – 2011-09-02 (×3): 30 mg via INTRAVENOUS
  Filled 2011-09-01 (×3): qty 1

## 2011-09-01 MED ORDER — OXYCODONE-ACETAMINOPHEN 5-325 MG PO TABS
1.0000 | ORAL_TABLET | ORAL | Status: DC | PRN
  Administered 2011-09-02 (×2): 2 via ORAL
  Filled 2011-09-01 (×2): qty 2

## 2011-09-01 MED ORDER — ROCURONIUM BROMIDE 100 MG/10ML IV SOLN
INTRAVENOUS | Status: DC | PRN
  Administered 2011-09-01: 10 mg via INTRAVENOUS
  Administered 2011-09-01: 50 mg via INTRAVENOUS
  Administered 2011-09-01 (×3): 10 mg via INTRAVENOUS

## 2011-09-01 MED ORDER — IBUPROFEN 800 MG PO TABS: 800.0000 mg | ORAL_TABLET | Freq: Three times a day (TID) | ORAL | Status: DC | PRN

## 2011-09-01 MED ORDER — HETASTARCH-ELECTROLYTES 6 % IV SOLN
INTRAVENOUS | Status: AC
  Filled 2011-09-01: qty 500

## 2011-09-01 MED ORDER — ONDANSETRON HCL 4 MG/2ML IJ SOLN
INTRAMUSCULAR | Status: AC
  Filled 2011-09-01: qty 2

## 2011-09-01 MED ORDER — HYDROMORPHONE HCL PF 1 MG/ML IJ SOLN
INTRAMUSCULAR | Status: AC
  Administered 2011-09-01: 0.5 mg via INTRAVENOUS
  Filled 2011-09-01: qty 1

## 2011-09-01 MED ORDER — PROPOFOL 10 MG/ML IV EMUL
INTRAVENOUS | Status: AC
  Filled 2011-09-01: qty 40

## 2011-09-01 MED ORDER — ONDANSETRON HCL 4 MG/2ML IJ SOLN
INTRAMUSCULAR | Status: DC | PRN
  Administered 2011-09-01: 4 mg via INTRAVENOUS

## 2011-09-01 MED ORDER — LACTATED RINGERS IR SOLN
Status: DC | PRN
  Administered 2011-09-01: 3000 mL

## 2011-09-01 MED ORDER — HYDROMORPHONE 0.3 MG/ML IV SOLN
INTRAVENOUS | Status: DC
  Administered 2011-09-01: 20:00:00 via INTRAVENOUS
  Administered 2011-09-01: 0.799 mg via INTRAVENOUS
  Administered 2011-09-02: 0.399 mg via INTRAVENOUS
  Administered 2011-09-02: 0.4 mg via INTRAVENOUS
  Filled 2011-09-01: qty 25

## 2011-09-01 MED ORDER — ACETAMINOPHEN 10 MG/ML IV SOLN
1000.0000 mg | Freq: Once | INTRAVENOUS | Status: AC
  Administered 2011-09-01: 1000 mg via INTRAVENOUS
  Filled 2011-09-01: qty 100

## 2011-09-01 MED ORDER — NEOSTIGMINE METHYLSULFATE 1 MG/ML IJ SOLN
INTRAMUSCULAR | Status: AC
  Filled 2011-09-01: qty 10

## 2011-09-01 MED ORDER — ONDANSETRON HCL 4 MG PO TABS: 4.0000 mg | ORAL_TABLET | Freq: Four times a day (QID) | ORAL | Status: DC | PRN

## 2011-09-01 MED ORDER — ARTIFICIAL TEARS OP OINT
TOPICAL_OINTMENT | OPHTHALMIC | Status: DC | PRN
  Administered 2011-09-01: 1 via OPHTHALMIC

## 2011-09-01 MED ORDER — DEXAMETHASONE SODIUM PHOSPHATE 10 MG/ML IJ SOLN
INTRAMUSCULAR | Status: DC | PRN
  Administered 2011-09-01: 10 mg via INTRAVENOUS

## 2011-09-01 MED ORDER — SUFENTANIL CITRATE 50 MCG/ML IV SOLN
INTRAVENOUS | Status: DC | PRN
  Administered 2011-09-01: 20 ug via INTRAVENOUS
  Administered 2011-09-01 (×3): 10 ug via INTRAVENOUS

## 2011-09-01 MED ORDER — ZOLPIDEM TARTRATE 5 MG PO TABS
5.0000 mg | ORAL_TABLET | Freq: Every evening | ORAL | Status: DC | PRN
Start: 2011-09-01 — End: 2011-09-02

## 2011-09-01 MED ORDER — DIPHENHYDRAMINE HCL 50 MG/ML IJ SOLN: 12.5000 mg | Freq: Four times a day (QID) | INTRAMUSCULAR | Status: DC | PRN

## 2011-09-01 MED ORDER — HYDROMORPHONE 0.3 MG/ML IV SOLN
INTRAVENOUS | Status: AC
  Filled 2011-09-01: qty 25

## 2011-09-01 MED ORDER — CEFAZOLIN SODIUM 1-5 GM-% IV SOLN
1.0000 g | Freq: Three times a day (TID) | INTRAVENOUS | Status: AC
  Administered 2011-09-01 – 2011-09-02 (×2): 1 g via INTRAVENOUS
  Filled 2011-09-01 (×2): qty 50

## 2011-09-01 MED ORDER — HYDROMORPHONE HCL PF 1 MG/ML IJ SOLN
0.2500 mg | INTRAMUSCULAR | Status: DC | PRN
  Administered 2011-09-01 (×4): 0.5 mg via INTRAVENOUS

## 2011-09-01 MED ORDER — LIDOCAINE HCL (CARDIAC) 20 MG/ML IV SOLN
INTRAVENOUS | Status: DC | PRN
  Administered 2011-09-01: 50 mg via INTRAVENOUS

## 2011-09-01 MED ORDER — SODIUM CHLORIDE 0.9 % IJ SOLN: 9.0000 mL | INTRAMUSCULAR | Status: DC | PRN

## 2011-09-01 MED ORDER — KETOROLAC TROMETHAMINE 30 MG/ML IJ SOLN: 30.0000 mg | Freq: Four times a day (QID) | INTRAMUSCULAR | Status: DC

## 2011-09-01 MED ORDER — MENTHOL 3 MG MT LOZG: 1.0000 | LOZENGE | OROMUCOSAL | Status: DC | PRN

## 2011-09-01 MED ORDER — NALOXONE HCL 0.4 MG/ML IJ SOLN: 0.4000 mg | INTRAMUSCULAR | Status: DC | PRN

## 2011-09-01 MED ORDER — GLYCOPYRROLATE 0.2 MG/ML IJ SOLN
INTRAMUSCULAR | Status: DC | PRN
  Administered 2011-09-01: .4 mg via INTRAVENOUS

## 2011-09-01 MED ORDER — ONDANSETRON HCL 4 MG/2ML IJ SOLN: 4.0000 mg | Freq: Four times a day (QID) | INTRAMUSCULAR | Status: DC | PRN

## 2011-09-01 MED ORDER — DEXAMETHASONE SODIUM PHOSPHATE 10 MG/ML IJ SOLN
INTRAMUSCULAR | Status: AC
  Filled 2011-09-01: qty 1

## 2011-09-01 SURGICAL SUPPLY — 56 items
ADH SKN CLS APL DERMABOND .7 (GAUZE/BANDAGES/DRESSINGS) ×4
BAG URINE DRAINAGE (UROLOGICAL SUPPLIES) ×5 IMPLANT
BARRIER ADHS 3X4 INTERCEED (GAUZE/BANDAGES/DRESSINGS) ×2 IMPLANT
BRR ADH 4X3 ABS CNTRL BYND (GAUZE/BANDAGES/DRESSINGS)
CABLE HIGH FREQUENCY MONO STRZ (ELECTRODE) ×5 IMPLANT
CANISTER SUCTION 2500CC (MISCELLANEOUS) ×5 IMPLANT
CATH FOLEY 3WAY  5CC 16FR (CATHETERS) ×1
CATH FOLEY 3WAY 5CC 16FR (CATHETERS) ×4 IMPLANT
CHLORAPREP W/TINT 26ML (MISCELLANEOUS) ×5 IMPLANT
CLOTH BEACON ORANGE TIMEOUT ST (SAFETY) ×5 IMPLANT
CONT PATH 16OZ SNAP LID 3702 (MISCELLANEOUS) ×5 IMPLANT
COVER MAYO STAND STRL (DRAPES) ×5 IMPLANT
COVER TABLE BACK 60X90 (DRAPES) ×10 IMPLANT
COVER TIP SHEARS 8 DVNC (MISCELLANEOUS) ×4 IMPLANT
COVER TIP SHEARS 8MM DA VINCI (MISCELLANEOUS) ×1
DECANTER SPIKE VIAL GLASS SM (MISCELLANEOUS) ×5 IMPLANT
DERMABOND ADVANCED (GAUZE/BANDAGES/DRESSINGS) ×1
DERMABOND ADVANCED .7 DNX12 (GAUZE/BANDAGES/DRESSINGS) ×4 IMPLANT
DRAPE HUG U DISPOSABLE (DRAPE) ×5 IMPLANT
DRAPE LG THREE QUARTER DISP (DRAPES) ×10 IMPLANT
DRAPE MONITOR DA VINCI (DRAPE) IMPLANT
DRAPE WARM FLUID 44X44 (DRAPE) ×5 IMPLANT
ELECT REM PT RETURN 9FT ADLT (ELECTROSURGICAL) ×5
ELECTRODE REM PT RTRN 9FT ADLT (ELECTROSURGICAL) ×4 IMPLANT
EVACUATOR SMOKE 8.L (FILTER) ×5 IMPLANT
GAUZE VASELINE 3X9 (GAUZE/BANDAGES/DRESSINGS) ×3 IMPLANT
GLOVE BIO SURGEON STRL SZ 6.5 (GLOVE) ×10 IMPLANT
GLOVE BIOGEL PI IND STRL 7.0 (GLOVE) ×12 IMPLANT
GLOVE BIOGEL PI INDICATOR 7.0 (GLOVE) ×3
GOWN PREVENTION PLUS LG XLONG (DISPOSABLE) ×20 IMPLANT
KIT ACCESSORY DA VINCI DISP (KITS) ×1
KIT ACCESSORY DVNC DISP (KITS) ×4 IMPLANT
NS IRRIG 1000ML POUR BTL (IV SOLUTION) ×15 IMPLANT
OCCLUDER COLPOPNEUMO (BALLOONS) ×3 IMPLANT
PACK LAVH (CUSTOM PROCEDURE TRAY) ×5 IMPLANT
PAD OB MATERNITY 4.3X12.25 (PERSONAL CARE ITEMS) ×5 IMPLANT
PAD PREP 24X48 CUFFED NSTRL (MISCELLANEOUS) ×10 IMPLANT
PLUG CATH AND CAP STER (CATHETERS) ×5 IMPLANT
SCISSORS LAP 5X35 DISP (ENDOMECHANICALS) IMPLANT
SET IRRIG TUBING LAPAROSCOPIC (IRRIGATION / IRRIGATOR) ×5 IMPLANT
SHEET LAVH (DRAPES) ×3 IMPLANT
SOLUTION ELECTROLUBE (MISCELLANEOUS) ×5 IMPLANT
SUT VIC AB 0 CT1 27 (SUTURE) ×25
SUT VIC AB 0 CT1 27XBRD ANTBC (SUTURE) ×20 IMPLANT
SUT VICRYL 0 UR6 27IN ABS (SUTURE) ×5 IMPLANT
SUT VICRYL 4-0 PS2 18IN ABS (SUTURE) ×10 IMPLANT
SYR 50ML LL SCALE MARK (SYRINGE) ×5 IMPLANT
SYSTEM CONVERTIBLE TROCAR (TROCAR) ×3 IMPLANT
TIP UTERINE 5.1X6CM LAV DISP (MISCELLANEOUS) ×6 IMPLANT
TOWEL OR 17X24 6PK STRL BLUE (TOWEL DISPOSABLE) ×15 IMPLANT
TROCAR 12M 150ML BLUNT (TROCAR) ×3 IMPLANT
TROCAR DISP BLADELESS 8 DVNC (TROCAR) ×2 IMPLANT
TROCAR DISP BLADELESS 8MM (TROCAR) ×1
TUBING FILTER THERMOFLATOR (ELECTROSURGICAL) ×5 IMPLANT
WARMER LAPAROSCOPE (MISCELLANEOUS) ×5 IMPLANT
WATER STERILE IRR 1000ML POUR (IV SOLUTION) ×5 IMPLANT

## 2011-09-01 NOTE — Anesthesia Preprocedure Evaluation (Addendum)
Anesthesia Evaluation    Airway       Dental   Pulmonary          Cardiovascular hypertension (was on BP meds, taken off. DBP was 95 in PAT, today it's higher.  EKG looks Okay),     Neuro/Psych    GI/Hepatic   Endo/Other    Renal/GU      Musculoskeletal   Abdominal   Peds  Hematology   Anesthesia Other Findings   Reproductive/Obstetrics                           Anesthesia Physical Anesthesia Plan  ASA: II  Anesthesia Plan: General   Post-op Pain Management:    Induction: Intravenous  Airway Management Planned: Oral ETT  Additional Equipment:   Intra-op Plan:   Post-operative Plan:   Informed Consent: I have reviewed the patients History and Physical, chart, labs and discussed the procedure including the risks, benefits and alternatives for the proposed anesthesia with the patient or authorized representative who has indicated his/her understanding and acceptance.   Dental Advisory Given and Dental advisory given  Plan Discussed with: CRNA and Surgeon  Anesthesia Plan Comments: (  Discussed  general anesthesia, including possible nausea, instrumentation of airway, sore throat,pulmonary aspiration, etc. I asked if the were any outstanding questions, or  concerns before we proceeded. )        Anesthesia Quick Evaluation

## 2011-09-01 NOTE — Brief Op Note (Signed)
09/01/2011  5:34 PM  PATIENT:  Erika Tate  45 y.o. female  PRE-OPERATIVE DIAGNOSIS:  Uterine Fibroids, dysmenorrhea/pelvic pain, menorrhagia  POST-OPERATIVE DIAGNOSIS:  Uterine Fibroids, menorrhagia, dysmenorrhea/pelvic pain, abdominopelvic adhesion  PROCEDURE:  Procedure(s): BILATERAL SALPINGECTOMY, EXTENSIVE LYSIS OF ADHESION DAVINCI ROBOTIC ASSISTED TOTAL HYSTERECTOMY  SURGEON:  Surgeon(s): Lavita Pontius Cathie Beams, MD Marie-Lyne Lavoie  PHYSICIAN ASSISTANT:   ASSISTANTS: Genia Del, MD  ANESTHESIA:   general  EBL:  Total I/O In: 2500 [I.V.:2000; IV Piggyback:500] Out: 650 [Urine:450; Blood:200] FINDINGS: bilateral ureters seen peristalsing, levorotated uterus due to severe/dense adhesion of right uterine serosa, right ovary and right round ligament adherent to ant abd wall, left ov w./ cyst and post uterus, nl appendix, omental adhesion to ant abd wall, nright ovary  BLOOD ADMINISTERED:none  DRAINS: none   LOCAL MEDICATIONS USED:  MARCAINE 10CC  SPECIMEN:  Source of Specimen:  uterus w/ cervix, tubes  DISPOSITION OF SPECIMEN:  PATHOLOGY  COUNTS:  YES  TOURNIQUET:  * No tourniquets in log *  DICTATION: .Other Dictation: Dictation Number [id number]  PLAN OF CARE: Admit for overnight observation  PATIENT DISPOSITION:  PACU - hemodynamically stable.   Delay start of Pharmacological VTE agent (>24hrs) due to surgical blood loss or risk of bleedings: NO

## 2011-09-01 NOTE — Transfer of Care (Signed)
Immediate Anesthesia Transfer of Care Note  Patient: Erika Tate  Procedure(s) Performed:  BILATERAL SALPINGECTOMY; LYSIS OF ADHESION; ROBOTIC ASSISTED TOTAL HYSTERECTOMY  Patient Location: PACU  Anesthesia Type: General  Level of Consciousness: oriented and sedated  Airway & Oxygen Therapy: Patient Spontanous Breathing and Patient connected to nasal cannula oxygen  Post-op Assessment: Report given to PACU RN and Post -op Vital signs reviewed and stable  Post vital signs: stable  Complications: No apparent anesthesia complications

## 2011-09-01 NOTE — Anesthesia Postprocedure Evaluation (Signed)
  Anesthesia Post-op Note  Patient: Erika Tate  Procedure(s) Performed:  BILATERAL SALPINGECTOMY; LYSIS OF ADHESION; ROBOTIC ASSISTED TOTAL HYSTERECTOMY  Patient Location: PACU  Anesthesia Type: General  Level of Consciousness: awake, alert  and oriented  Airway and Oxygen Therapy: Patient Spontanous Breathing  Post-op Pain: none  Post-op Assessment: Post-op Vital signs reviewed, Patient's Cardiovascular Status Stable, Respiratory Function Stable, Patent Airway, No signs of Nausea or vomiting and Adequate PO intake  Post-op Vital Signs: Reviewed and stable  Complications: No apparent anesthesia complications

## 2011-09-02 LAB — CBC
HCT: 33.6 % — ABNORMAL LOW (ref 36.0–46.0)
MCH: 27.4 pg (ref 26.0–34.0)
MCHC: 33 g/dL (ref 30.0–36.0)
MCV: 83 fL (ref 78.0–100.0)
RDW: 14.2 % (ref 11.5–15.5)

## 2011-09-02 LAB — BASIC METABOLIC PANEL
BUN: 10 mg/dL (ref 6–23)
Calcium: 8.5 mg/dL (ref 8.4–10.5)
Creatinine, Ser: 0.76 mg/dL (ref 0.50–1.10)
GFR calc Af Amer: >90 mL/min (ref >90)
GFR calc non Af Amer: >90 mL/min (ref >90)

## 2011-09-02 MED ORDER — IBUPROFEN 800 MG PO TABS: 800.0000 mg | ORAL_TABLET | Freq: Three times a day (TID) | ORAL | Status: AC | PRN

## 2011-09-02 MED ORDER — HYDROMORPHONE HCL 4 MG PO TABS: 4.0000 mg | ORAL_TABLET | ORAL | Status: AC | PRN

## 2011-09-02 NOTE — Progress Notes (Signed)
Pt. Discharged home in stable condition. Has not passed flatus, however has no complaints at present and abdomen is soft with + bwoel sounds. Dr. Ronnie Doss aware.  Tia Alert A

## 2011-09-02 NOTE — Progress Notes (Signed)
Subjective: Patient reports tolerating PO and no problems voiding.    Objective: I have reviewed patient's vital signs.  vital signs, intake and output and labs. Filed Vitals:   09/02/11 0545  BP:   Pulse:   Temp:   Resp: 18  BP 101/65, P98, Temp 98.2 I/O last 3 completed shifts: In: 4670 [P.O.:720; I.V.:3350; IV Piggyback:600] Out: 1650 [Urine:1450; Blood:200] Total I/O In: -  Out: 150 [Urine:150]  Lab Results  Component Value Date   WBC 10.3 09/02/2011   HGB 11.1* 09/02/2011   HCT 33.6* 09/02/2011   MCV 83.0 09/02/2011   PLT 182 09/02/2011   Lab Results  Component Value Date   CREATININE 0.76 09/02/2011    EXAM General: alert, cooperative and no distress Resp: clear to auscultation bilaterally Cardio: regular rate and rhythm, S1, S2 normal, no murmur, click, rub or gallop GI: soft, non-tender; bowel sounds normal; no masses,  no organomegaly Extremities: no edema, redness or tenderness in the calves or thighs Vaginal Bleeding: minimal  Assessment: s/p Procedure(s): BILATERAL SALPINGECTOMY LYSIS OF ADHESION ROBOTIC ASSISTED TOTAL HYSTERECTOMY: stable  Plan: Advance diet Discontinue IV fluids Discharge home d/c instructions reviewed. Script: motrin, dilaudid(#30) 4 mg F/u 2 wk  LOS: 1 day    Dimetri Armitage A, MD 09/02/2011 8:10 AM    09/02/2011, 8:10 AM

## 2011-09-02 NOTE — Op Note (Signed)
NAMEMarland Tate  [name], [name]            ACCOUNT NO.:  [id number]  MEDICAL RECORD NO.:  [id number]  LOCATION:  9305                          FACILITY:  [facility]  PHYSICIAN:  Maxie Better, M.D.DATE OF BIRTH:  25-Apr-1966  DATE OF PROCEDURE:  09/01/2011 DATE OF DISCHARGE:                              OPERATIVE REPORT   PREOPERATIVE DIAGNOSES:  Dysmenorrhea/pelvic pain, menorrhagia, uterine fibroids.  POSTOPERATIVE DIAGNOSES:  Abdominal pelvic adhesions,  menorrhagia, severe dysmenorrhea/pelvic pain.  PROCEDURES:  Da Vinci robotic total hysterectomy, bilateral salpingectomy, extensive lysis of adhesions.  ANESTHESIA:  General.  SURGEON:  Maxie Better, MD  ASSISTANT:  Genia Del, MD  PROCEDURE:  Under adequate general anesthesia, the patient was positioned for robotic surgery in the dorsal lithotomy position.  She was ChloraPrep and Betadine prepped in the usual fashion.  The patient was then sterilely draped.  Examination under anesthesia revealed anteverted uterus slightly enlarged, no adnexal masses could be appreciated.  A weighted speculum was placed in the vagina.  Sims retractor was placed anteriorly.  Single-tooth tenaculum was placed on the anterior and posterior lip of the cervix.  The cervix was slightly flushed to the vagina wall posteriorly due to the patient's prior history of LEEP procedures.  The cervix was serially dilated.  The uterus sounded to 7 cm.   0 Vicryl figure-of-eight sutures was placed in the anterior and posterior lip of the cervix.  A #6 intracavitary balloon manipulator and a small RUMI KOH ring was inserted.  Insufflation of balloon resulted in the balloon popping; however, no other options were available and therefore, the balloon was not replaced and the figure-of- eight sutures anterior and posterior lip was used to tie to the uterine manipulator.  Once that was done, 3-way Foley catheter was placed.  The other instruments in the  vagina was removed.  Attention was turned to the abdomen.  Given the small uterus, a supraumbilical incision was then made, carried down through the rectus fascia, which was then opened.  Pursestring was placed around the fascial area and the Hasson cannula was introduced into the abdominal cavity without incident.  The abdomen was insufflated.  The robotic camera was then inserted.  On insertion, it was noted that there was omental adhesion to the anterior abdominal wall.  There was a levorotation of the uterus with right side of the uterus and the ovary and the round ligament all attached adherent densely to the anterior abdominal wall.  The bladder reflection could not be seen well and the left ovary contained cystic mass, shortened round ligament as well.  The posterior cul-de-sac was without any lesions.  The liver edge was normal and the appendix was normal.  Both ureters could be seen peristalsing in the pelvis deep.  At that point, robotic ports two 8-mm on the left, one on the right, and a 5-mm assistant port was all placed under direct visualization.  The robot was then docked to the patient's left side.  The monopolar scissors, the PK and these Prograsper were all placed in through the robotic ports.  I then went to the surgical console.  At the surgical console, the omental adhesions were lysed.  Attention was then turned to the uterus.  The ureters again were identified bilaterally.  The left round ligament could be seen and therefore was clamped, cauterized, and cut.  The left utero-ovarian ligament, which were shortened was clamped, cauterized, and partially cut.  The ovarian cyst on the left was adherent partially to the posterior wall of the uterus and this was taken off with blunt and sharp dissection, continued clamp, cauterization and cut.  The utero-ovarian ligament was then continued down until where the bladder reflection should be noted was reached. The bladder was  retrofilled with 300 mL of fluids and kept filled during the procedure for period of time.  Attention was then turned to the right side where the ovary was adherent to the anterior abdominal wall. Sharp and dissection of cauterization was done to remove the ovary off of the anterior abdominal wall.  In doing so, a small segment of the right round ligament was then found which was clamped, cauterized and cut.  The dissection was then continued carefully as the bladder still was not fully appreciated with the 300 mL of bladder fluid.  The remaining adhesions anteriorly with blunt and sharp dissection was dissected down. The right shortened utero-ovarian ligament was continued to be clamped, cauterized, and cut, and carried down further as a dense adhesions anterior which in the front of the bladder was also being blunt and sharp dissection, performed particular from the lateral side and either side of the adhesions.  The bladder was emptied and then the dissection was continued for about an hour and a half.  The adhesions on the bladder was carefully cut with sharp and blunt dissection.  Finally, the anterior abdominal wall was off of the uterus.  The uterine vessels were then found on the right as well as on the left.  The bladder was then further displaced inferiorly with sharp dissection.  The uterine vessels were then bilaterally clamped, cauterized, and cut ultimately.  Once this was achieved well, the vagina was insufflated.  The KOH ring was further assessed to be in this right place and circumferential severing of the uterus from its vaginal attachments was done with the monopolar scissors circumferentially.  Once this was done, there was a pumping vessel noted on the right angle, which subsequently was hemostasis with PK dissector.  Bleeding in the posterior leaf of the vaginal cuff was also cauterized.  Good hemostasis was noted.  Attention was then turned back to the adnexa.  The  left ovarian cyst that incidentally ruptured during the removal of the ovary from the attachment off to the uterus. Both mesosalpinx of the fallopian tubes were then serially clamped, cauterized, and cut, and both tubes were subsequently removed, placed through the vagina.  Once the uterus with cervix had been severed, it was removed through the vagina as well. Inspection of the vaginal cuff was done with small bleeders, cauterized.  The 0 V-Loc 9-inch suture was then used to close the vaginal cuff.  The anterior abdominal wall where the adhesions had been had some bleeding which was subsequently cauterized.  Irrigation of the pelvis was then performed, good hemostasis noted.  Re-insufflation of the bladder showed an intact bladder without any leakage.  Both pedicles with good hemostasis.  At that point, the instruments from the robot was removed, the robot was undocked.  An 8.5-mm scope was then used to remove the needle from the abdomen.  The abdomen was then copiously irrigated and suctioned.  The upper abdomen was also suctioned with good hemostasis noted.  The  lower ports were removed bilaterally under direct visualization.  The upper ports were then removed under direct visualization taking care not to bring up any underlying structures.  On coming out, there were omental adhesions still remaining which was then lysed.  Hot scissors were used to cauterize any small bleeders.  The supraumbilical site was closed with 0 Vicryl and additional 0 Vicryl figure-of-eight sutures along with a pursestring.  The skin incisions were then approximated using 4-0 Vicryl subcuticular stitch.  Bimanual exam revealed that the vaginal cuff was well approximated. Specimen was uterus with cervix and fallopian tubes all sent to pathology.  Estimated blood loss was probably 200 mL.  Intraoperative fluid 2 L.  Urine output 300 mL. Sponge and instrument counts x2 was correct.  Complication was none. The  patient tolerated the procedure well, was transferred to the recovery room in stable condition.     Maxie Better, M.D.     [place]/MEDQ  D:  09/01/2011  T:  09/02/2011  Job:  [id number]

## 2011-09-02 NOTE — Discharge Summary (Signed)
Physician Discharge Summary  Patient ID: SHASTINA RUA MRN: [id number] DOB/AGE: 1965-12-27 45 y.o.  Admit date: 09/01/2011 Discharge date: 09/02/2011  Admission Diagnoses: uterine fibroid, menorrhagia, dysmenorrhea/pelvic pain  Discharge Diagnoses: same, abdominopelvic adhesions Active Problems:  * No active hospital problems. *    Discharged Condition: stable  Hospital Course: uncomplicated post op care  Consults: none Significant Diagnostic Studies: labs:hgb  11.1, hct 33.6, wbc 10.3 plt 182, creat 0.76  Treatments: surgery: davinci robotic TLH, bilateral salpingectomy, abdominopelvic adhesions  Discharge Exam: Blood pressure 101/65, pulse 92, temperature 98.2 F (36.8 C), temperature source Oral, resp. rate 18, height 5\' 2"  (1.575 m), weight 65.772 kg (145 lb), SpO2 99.00%. General appearance: alert, cooperative and no distress Resp: clear to auscultation bilaterally Cardio: regular rate and rhythm, S1, S2 normal, no murmur, click, rub or gallop GI: soft, non-tender; bowel sounds normal; no masses,  no organomegaly Pelvic: deferred Extremities: no edema, redness or tenderness in the calves or thighs Incision/Wound:  Disposition: Home or Self Care  Discharge Orders    Future Orders Please Complete By Expires   Diet - low sodium heart healthy      Increase activity slowly      Discharge instructions      Comments:   Call if temperature greater than equal to 100.4, nothing per vagina for 4-6 weeks or severe nausea vomiting, increased incisional pain , drainage or redness in the incision site, no straining with bowel movements, showers no bath, no lifting >25 lb. No driving x 2 weeks   May shower / Bathe      May walk up steps        Current Discharge Medication List    START taking these medications   Details  HYDROmorphone (DILAUDID) 4 MG tablet Take 1 tablet (4 mg total) by mouth every 4 (four) hours as needed for pain. Qty: 30 tablet, Refills: 0      ibuprofen (ADVIL,MOTRIN) 800 MG tablet Take 1 tablet (800 mg total) by mouth every 8 (eight) hours as needed for pain (mild pain). Qty: 30 tablet, Refills: 3      STOP taking these medications     norethindrone (MICRONOR,CAMILA,ERRIN) 0.35 MG tablet      oxyCODONE (OXYCONTIN) 10 MG 12 hr tablet          Signed: Carle Fenech A 09/02/2011, 8:05 AM

## 2011-12-18 ENCOUNTER — Encounter (HOSPITAL_COMMUNITY): Payer: Self-pay | Admitting: *Deleted

## 2011-12-18 ENCOUNTER — Emergency Department (INDEPENDENT_AMBULATORY_CARE_PROVIDER_SITE_OTHER)
Admission: EM | Admit: 2011-12-18 | Discharge: 2011-12-18 | Disposition: A | Discharge status: Home / Self Care | Payer: 59 | Source: Home / Self Care | Attending: Emergency Medicine | Admitting: Emergency Medicine

## 2011-12-18 DIAGNOSIS — I10 Essential (primary) hypertension: Secondary | ICD-10-CM

## 2011-12-18 DIAGNOSIS — J329 Chronic sinusitis, unspecified: Secondary | ICD-10-CM

## 2011-12-18 HISTORY — DX: Chronic sinusitis, unspecified: J32.9

## 2011-12-18 MED ORDER — GUAIFENESIN ER 600 MG PO TB12: 1200.0000 mg | ORAL_TABLET | Freq: Two times a day (BID) | ORAL | Status: AC

## 2011-12-18 MED ORDER — AMOXICILLIN-POT CLAVULANATE 875-125 MG PO TABS: 1.0000 | ORAL_TABLET | Freq: Two times a day (BID) | ORAL | Status: AC

## 2011-12-18 MED ORDER — FLUTICASONE PROPIONATE 50 MCG/ACT NA SUSP: 2.0000 | Freq: Every day | NASAL | Status: AC

## 2011-12-18 NOTE — Discharge Instructions (Signed)
Decrease your salt intake. diet and exercise will lower your blood pressure significantly. It is important to keep your blood pressure under good control, as having a elevated for prolonged periods of time significantly increases your risk of stroke, heart attacks, kidney damage, eye damage, and other problems. Return immediately to the ER if you start having chest pain, headache, problems seeing, problems talking, problems walking, if you feel like you're about to pass out, if you do pass out, if you have a seizure, or for any other concerns..  Take the medication as written. Return if you get worse, have a fever >100.4, or for any concerns. You may take 600 mg of motrin with 1 gram of tylenol up to 4 times a day as needed for pain. This is an effective combination for pain.  Most sinus infections are viral and do not need antibiotics unless you have a high fever, have had this for 10 day, or you get better and then get sick again. Use a neti pot or the NeilMed sinus rinse as often as you want to to reduce nasal congestion. Follow the directions on the box.   Go to www.goodrx.com to look up your medications. This will give you a list of where you can find your prescriptions at the most affordable prices.

## 2011-12-18 NOTE — ED Provider Notes (Signed)
History     CSN: [id number]  Arrival date & time 12/18/11  1429   First MD Initiated Contact with Patient 12/18/11 1643      Chief Complaint  Patient presents with  . Sinusitis    (Consider location/radiation/quality/duration/timing/severity/associated sxs/prior treatment) HPI Comments: Pt with nasal congestion, postnasal drip, ST, b/l frontal sinus pain/pressure worse with bending forward/lying down x 5 days, ear fullness/pain. States that her upper teeth hurt. No fevers, N/V, other HA, bodyaches,  purulent nasal d/c. Taking Claritin: Mucinex D, ibuprofen 600 mg with some relief. States feels simlar to prev episodes of sinusitis. Patient was treated for sinus infection with azithromycin approximately 3 weeks ago. States she completely recovered from this, however, her symptoms started 5 days ago.   Patient is a 46 y.o. female presenting with sinusitis. The history is provided by the patient.  Sinusitis  This is a new problem. There has been no fever. Associated symptoms include congestion, ear pain and sinus pressure. Pertinent negatives include no chills, no sweats, no hoarse voice, no sore throat, no cough and no shortness of breath.    Past Medical History  Diagnosis Date  . Endometriosis   . Fibroids   . Hx of myomectomy   . Hypertension     no meds- being monitored  . Complication of anesthesia     nerve pain during CS spinal  . Sinusitis     Past Surgical History  Procedure Date  . Diagnostic laparoscopy   . Myomectomy   . Cesarean section     x 2  . Abdominal hysterectomy     History reviewed. No pertinent family history.  History  Substance Use Topics  . Smoking status: Never Smoker   . Smokeless tobacco: Not on file  . Alcohol Use: No    OB History    Grav Para Term Preterm Abortions TAB SAB Ect Mult Living                  Review of Systems  Constitutional: Negative for chills.  HENT: Positive for ear pain, congestion and sinus pressure. Negative  for sore throat and hoarse voice.   Respiratory: Negative for cough and shortness of breath.   Cardiovascular: Negative for chest pain.  Gastrointestinal: Negative for nausea and vomiting.  Neurological: Negative for dizziness, facial asymmetry and weakness.    Allergies  Aspirin  Home Medications   Current Outpatient Rx  Name Route Sig Dispense Refill  . IBUPROFEN 600 MG PO TABS Oral Take 600 mg by mouth every 6 (six) hours as needed.    . AMOXICILLIN-POT CLAVULANATE 875-125 MG PO TABS Oral Take 1 tablet by mouth 2 (two) times daily. X 7 days 14 tablet 0  . FLUTICASONE PROPIONATE 50 MCG/ACT NA SUSP Nasal Place 2 sprays into the nose daily. 16 g 0  . GUAIFENESIN ER 600 MG PO TB12 Oral Take 2 tablets (1,200 mg total) by mouth 2 (two) times daily. 28 tablet 0    BP 173/128  Pulse 98  Temp(Src) 98.5 F (36.9 C) (Oral)  Resp 14  SpO2 99%  LMP 08/10/2011  Physical Exam  Nursing note and vitals reviewed. Constitutional: She is oriented to person, place, and time. She appears well-developed and well-nourished. No distress.  HENT:  Head: Normocephalic and atraumatic.  Right Ear: Tympanic membrane normal.  Left Ear: Tympanic membrane normal.  Nose: Mucosal edema and rhinorrhea present. Right sinus exhibits maxillary sinus tenderness. Right sinus exhibits no frontal sinus tenderness. Left sinus exhibits  maxillary sinus tenderness. Left sinus exhibits no frontal sinus tenderness.  Mouth/Throat: Uvula is midline, oropharynx is clear and moist and mucous membranes are normal.  Eyes: Conjunctivae and EOM are normal. Pupils are equal, round, and reactive to light.  Neck: Normal range of motion. Neck supple.  Cardiovascular: Normal rate, regular rhythm and normal heart sounds.   Pulmonary/Chest: Effort normal and breath sounds normal.  Abdominal: Soft. Bowel sounds are normal. She exhibits no distension.  Musculoskeletal: Normal range of motion.  Lymphadenopathy:    She has no cervical  adenopathy.  Neurological: She is alert and oriented to person, place, and time. She has normal strength. She displays no tremor. No cranial nerve deficit or sensory deficit. Coordination and gait normal.  Skin: Skin is warm and dry.  Psychiatric: She has a normal mood and affect. Her behavior is normal. Judgment and thought content normal.    ED Course  Procedures (including critical care time)  Labs Reviewed - No data to display No results found.   1. Sinusitis   2. Hypertension     MDM  Normal head ct 12/2010. Prev records reviewed.  Pt hypertensive today.  Repeat manual Bp 168/98. Pt has been taking mucinex-d, which is most likely elevating her pressure. Will have her stop this.   Pt has no evidence of end organ damage. Pt states HA is c/w prev episodes of sinusitis. States this is not similar to HA when her BP is elevated. Pt denies any CNS type sx such as visual changes, focal paresis, or new onset seizure activity. Pt denies any CV sx such as CP, dyspnea, palpitations, pedal edema, tearing pain radiating to back or abd. Pt denied any renal sx such as anuria or hematuria. Pt denies illicit drug use, most notably cocaine. Discussed importance of lifestyle modifications as important first steps, and the importance of f/u as OP. D/w to go to ED. Pt agrees with plan. Also d/w pt to wait and fill abx for sinusitis.     Luiz Blare, MD 12/18/11 [phone]

## 2011-12-18 NOTE — ED Notes (Signed)
Pt c/o sinus congestion/pressure onset Saturday night.  Denies fever.  States ears feel full and having clear nasal drainage.  C/o post nasal drainage.

## 2011-12-26 ENCOUNTER — Other Ambulatory Visit (HOSPITAL_COMMUNITY): Payer: Self-pay | Admitting: Obstetrics and Gynecology

## 2011-12-26 DIAGNOSIS — Z1231 Encounter for screening mammogram for malignant neoplasm of breast: Secondary | ICD-10-CM

## 2012-01-23 ENCOUNTER — Ambulatory Visit (HOSPITAL_COMMUNITY)
Admission: RE | Admit: 2012-01-23 | Discharge: 2012-01-23 | Disposition: A | Discharge status: Home / Self Care | Payer: 59 | Source: Ambulatory Visit | Attending: Obstetrics and Gynecology | Admitting: Obstetrics and Gynecology

## 2012-01-23 DIAGNOSIS — Z1231 Encounter for screening mammogram for malignant neoplasm of breast: Secondary | ICD-10-CM | POA: Insufficient documentation

## 2013-03-28 ENCOUNTER — Other Ambulatory Visit (HOSPITAL_COMMUNITY): Payer: Self-pay | Admitting: Obstetrics and Gynecology

## 2013-03-28 DIAGNOSIS — Z1231 Encounter for screening mammogram for malignant neoplasm of breast: Secondary | ICD-10-CM

## 2013-04-02 ENCOUNTER — Ambulatory Visit (HOSPITAL_COMMUNITY)
Admission: RE | Admit: 2013-04-02 | Discharge: 2013-04-02 | Disposition: A | Discharge status: Home / Self Care | Payer: 59 | Source: Ambulatory Visit | Attending: Obstetrics and Gynecology | Admitting: Obstetrics and Gynecology

## 2013-04-02 DIAGNOSIS — Z1231 Encounter for screening mammogram for malignant neoplasm of breast: Secondary | ICD-10-CM | POA: Insufficient documentation

## 2014-03-13 ENCOUNTER — Other Ambulatory Visit (HOSPITAL_COMMUNITY): Payer: Self-pay | Admitting: Obstetrics and Gynecology

## 2014-03-13 DIAGNOSIS — Z1231 Encounter for screening mammogram for malignant neoplasm of breast: Secondary | ICD-10-CM

## 2014-04-08 ENCOUNTER — Ambulatory Visit (HOSPITAL_COMMUNITY): Payer: 59

## 2014-04-15 ENCOUNTER — Ambulatory Visit (HOSPITAL_COMMUNITY): Payer: 59

## 2014-04-29 ENCOUNTER — Ambulatory Visit (HOSPITAL_COMMUNITY): Payer: 59

## 2014-06-05 ENCOUNTER — Ambulatory Visit (HOSPITAL_COMMUNITY)
Admission: RE | Admit: 2014-06-05 | Discharge: 2014-06-05 | Disposition: A | Discharge status: Home / Self Care | Payer: 59 | Source: Ambulatory Visit | Attending: Obstetrics and Gynecology | Admitting: Obstetrics and Gynecology

## 2014-06-05 DIAGNOSIS — Z1231 Encounter for screening mammogram for malignant neoplasm of breast: Secondary | ICD-10-CM | POA: Diagnosis not present

## 2015-05-28 ENCOUNTER — Other Ambulatory Visit (HOSPITAL_COMMUNITY): Payer: Self-pay | Admitting: Obstetrics and Gynecology

## 2015-05-28 DIAGNOSIS — Z1231 Encounter for screening mammogram for malignant neoplasm of breast: Secondary | ICD-10-CM

## 2015-06-07 ENCOUNTER — Ambulatory Visit (HOSPITAL_COMMUNITY): Payer: 59

## 2015-08-04 ENCOUNTER — Other Ambulatory Visit: Payer: Self-pay

## 2015-08-04 DIAGNOSIS — Z1231 Encounter for screening mammogram for malignant neoplasm of breast: Secondary | ICD-10-CM

## 2015-08-09 ENCOUNTER — Ambulatory Visit
Admission: RE | Admit: 2015-08-09 | Discharge: 2015-08-09 | Disposition: A | Discharge status: Home / Self Care | Payer: 59 | Source: Ambulatory Visit

## 2015-08-09 DIAGNOSIS — Z1231 Encounter for screening mammogram for malignant neoplasm of breast: Secondary | ICD-10-CM

## 2015-08-10 ENCOUNTER — Other Ambulatory Visit: Payer: Self-pay | Admitting: Obstetrics and Gynecology

## 2015-08-10 DIAGNOSIS — R928 Other abnormal and inconclusive findings on diagnostic imaging of breast: Secondary | ICD-10-CM

## 2015-08-17 ENCOUNTER — Other Ambulatory Visit: Payer: Self-pay | Admitting: Family Medicine

## 2015-08-17 DIAGNOSIS — R928 Other abnormal and inconclusive findings on diagnostic imaging of breast: Secondary | ICD-10-CM

## 2015-08-26 ENCOUNTER — Ambulatory Visit
Admission: RE | Admit: 2015-08-26 | Discharge: 2015-08-26 | Disposition: A | Discharge status: Home / Self Care | Payer: 59 | Source: Ambulatory Visit | Attending: Family Medicine | Admitting: Family Medicine

## 2015-08-26 DIAGNOSIS — R928 Other abnormal and inconclusive findings on diagnostic imaging of breast: Secondary | ICD-10-CM

## 2015-10-06 MED FILL — FLUTICASONE PROP 50 MCG SPR: 50 | 30 days supply | Qty: 16 | Fill #0

## 2015-10-06 MED FILL — AZITHROMYCIN 250 MG TABLET: 250 | 5 days supply | Qty: 6 | Fill #0

## 2015-11-09 MED FILL — LISINOPRIL-HCTZ 10-12.5 MG: 10-12.5 | 90 days supply | Qty: 90 | Fill #0

## 2015-12-22 ENCOUNTER — Other Ambulatory Visit: Payer: Self-pay | Admitting: Internal Medicine

## 2015-12-22 ENCOUNTER — Ambulatory Visit
Admission: RE | Admit: 2015-12-22 | Discharge: 2015-12-22 | Disposition: A | Discharge status: Home / Self Care | Payer: 59 | Source: Ambulatory Visit | Attending: Internal Medicine | Admitting: Internal Medicine

## 2015-12-22 DIAGNOSIS — R0602 Shortness of breath: Secondary | ICD-10-CM | POA: Diagnosis not present

## 2015-12-22 DIAGNOSIS — R0600 Dyspnea, unspecified: Secondary | ICD-10-CM

## 2015-12-22 DIAGNOSIS — I10 Essential (primary) hypertension: Secondary | ICD-10-CM | POA: Diagnosis not present

## 2015-12-22 DIAGNOSIS — R002 Palpitations: Secondary | ICD-10-CM | POA: Diagnosis not present

## 2015-12-22 DIAGNOSIS — L709 Acne, unspecified: Secondary | ICD-10-CM | POA: Diagnosis not present

## 2015-12-22 MED FILL — ADAPALENE 0.3% GEL PUMP: 0.3 | 30 days supply | Qty: 45 | Fill #0

## 2015-12-24 MED FILL — KLOR-CON M20 TABLET: 20 | 30 days supply | Qty: 30 | Fill #0

## 2015-12-24 MED FILL — VIT D2 1.25 MG (50,000 UNIT: 1.25 MG | 84 days supply | Qty: 12 | Fill #0

## 2016-01-11 DIAGNOSIS — H5203 Hypermetropia, bilateral: Secondary | ICD-10-CM | POA: Diagnosis not present

## 2016-01-11 DIAGNOSIS — H52221 Regular astigmatism, right eye: Secondary | ICD-10-CM | POA: Diagnosis not present

## 2016-01-11 DIAGNOSIS — H04123 Dry eye syndrome of bilateral lacrimal glands: Secondary | ICD-10-CM | POA: Diagnosis not present

## 2016-01-11 DIAGNOSIS — H524 Presbyopia: Secondary | ICD-10-CM | POA: Diagnosis not present

## 2016-01-12 ENCOUNTER — Telehealth: Payer: Self-pay | Admitting: Cardiovascular Disease

## 2016-01-12 DIAGNOSIS — E559 Vitamin D deficiency, unspecified: Secondary | ICD-10-CM | POA: Diagnosis not present

## 2016-01-12 DIAGNOSIS — R0600 Dyspnea, unspecified: Secondary | ICD-10-CM | POA: Diagnosis not present

## 2016-01-12 DIAGNOSIS — E876 Hypokalemia: Secondary | ICD-10-CM | POA: Diagnosis not present

## 2016-01-12 NOTE — Telephone Encounter (Signed)
Received records from [place] for appointment on 01/20/16 with Dr Oval Linsey.  Records given to [facility] (medical records) for Dr Blenda Mounts schedule on 01/20/16. lp

## 2016-01-20 ENCOUNTER — Ambulatory Visit: Payer: 59 | Admitting: Cardiovascular Disease

## 2016-02-07 MED FILL — LISINOPRIL-HCTZ 10-12.5 MG: 10-12.5 | 90 days supply | Qty: 90 | Fill #0

## 2016-02-07 MED FILL — KLOR-CON M20 TABLET: 20 | 30 days supply | Qty: 30 | Fill #1

## 2016-04-10 DIAGNOSIS — R921 Mammographic calcification found on diagnostic imaging of breast: Secondary | ICD-10-CM | POA: Diagnosis not present

## 2016-04-10 DIAGNOSIS — R002 Palpitations: Secondary | ICD-10-CM | POA: Diagnosis not present

## 2016-04-10 DIAGNOSIS — I10 Essential (primary) hypertension: Secondary | ICD-10-CM | POA: Diagnosis not present

## 2016-04-10 DIAGNOSIS — Z0000 Encounter for general adult medical examination without abnormal findings: Secondary | ICD-10-CM | POA: Diagnosis not present

## 2016-04-10 DIAGNOSIS — E559 Vitamin D deficiency, unspecified: Secondary | ICD-10-CM | POA: Diagnosis not present

## 2016-05-03 MED FILL — LISINOPRIL-HCTZ 10-12.5 MG: 10-12.5 | 90 days supply | Qty: 90 | Fill #0

## 2016-06-03 MED FILL — KLOR-CON M20 TABLET: 20 | 30 days supply | Qty: 30 | Fill #2

## 2016-06-26 MED FILL — AZITHROMYCIN 250 MG TABLET: 250 | 5 days supply | Qty: 6 | Fill #0

## 2016-07-31 DIAGNOSIS — J209 Acute bronchitis, unspecified: Secondary | ICD-10-CM | POA: Diagnosis not present

## 2016-07-31 DIAGNOSIS — R05 Cough: Secondary | ICD-10-CM | POA: Diagnosis not present

## 2016-07-31 MED FILL — LISINOPRIL-HCTZ 10-12.5 MG: 10-12.5 | 90 days supply | Qty: 90 | Fill #0

## 2016-07-31 MED FILL — HYDROCODONE-CHLORPHENIRAM S: 10-8 | 70 days supply | Qty: 70 | Fill #0

## 2016-07-31 MED FILL — DOXYCYCLINE HYCLATE 100 MG: 100 | 5 days supply | Qty: 10 | Fill #0

## 2016-08-16 MED FILL — KLOR-CON M20 TABLET: 20 | 30 days supply | Qty: 30 | Fill #3

## 2016-09-21 ENCOUNTER — Other Ambulatory Visit: Payer: Self-pay | Admitting: Obstetrics and Gynecology

## 2016-09-21 DIAGNOSIS — Z1231 Encounter for screening mammogram for malignant neoplasm of breast: Secondary | ICD-10-CM

## 2016-09-29 ENCOUNTER — Ambulatory Visit
Admission: RE | Admit: 2016-09-29 | Discharge: 2016-09-29 | Disposition: A | Discharge status: Home / Self Care | Payer: 59 | Source: Ambulatory Visit | Attending: Obstetrics and Gynecology | Admitting: Obstetrics and Gynecology

## 2016-09-29 DIAGNOSIS — Z1231 Encounter for screening mammogram for malignant neoplasm of breast: Secondary | ICD-10-CM

## 2016-10-05 ENCOUNTER — Other Ambulatory Visit: Payer: Self-pay | Admitting: Obstetrics and Gynecology

## 2016-10-05 DIAGNOSIS — R928 Other abnormal and inconclusive findings on diagnostic imaging of breast: Secondary | ICD-10-CM

## 2016-10-12 ENCOUNTER — Ambulatory Visit
Admission: RE | Admit: 2016-10-12 | Discharge: 2016-10-12 | Disposition: A | Discharge status: Home / Self Care | Payer: 59 | Source: Ambulatory Visit | Attending: Obstetrics and Gynecology | Admitting: Obstetrics and Gynecology

## 2016-10-12 DIAGNOSIS — R922 Inconclusive mammogram: Secondary | ICD-10-CM | POA: Diagnosis not present

## 2016-10-12 DIAGNOSIS — R928 Other abnormal and inconclusive findings on diagnostic imaging of breast: Secondary | ICD-10-CM

## 2016-10-30 MED FILL — LISINOPRIL-HCTZ 10-12.5 MG: 10-12.5 | 90 days supply | Qty: 90 | Fill #1

## 2016-11-30 MED FILL — POTASSIUM CL ER 20 MEQ TAB: 20 | 90 days supply | Qty: 90 | Fill #0

## 2017-01-23 DIAGNOSIS — M79672 Pain in left foot: Secondary | ICD-10-CM | POA: Diagnosis not present

## 2017-01-23 MED FILL — IBUPROFEN 800 MG TABLET: 800 | 10 days supply | Qty: 20 | Fill #0

## 2017-02-01 MED FILL — LISINOPRIL-HCTZ 10-12.5 MG: 10-12.5 | 90 days supply | Qty: 90 | Fill #2

## 2017-02-06 DIAGNOSIS — Z01419 Encounter for gynecological examination (general) (routine) without abnormal findings: Secondary | ICD-10-CM | POA: Diagnosis not present

## 2017-02-06 DIAGNOSIS — Z6829 Body mass index (BMI) 29.0-29.9, adult: Secondary | ICD-10-CM | POA: Diagnosis not present

## 2017-02-09 DIAGNOSIS — H5203 Hypermetropia, bilateral: Secondary | ICD-10-CM | POA: Diagnosis not present

## 2017-02-13 ENCOUNTER — Ambulatory Visit (INDEPENDENT_AMBULATORY_CARE_PROVIDER_SITE_OTHER): Payer: 59 | Admitting: Sports Medicine

## 2017-02-13 ENCOUNTER — Encounter: Payer: Self-pay | Admitting: Sports Medicine

## 2017-02-13 ENCOUNTER — Ambulatory Visit (INDEPENDENT_AMBULATORY_CARE_PROVIDER_SITE_OTHER): Payer: 59

## 2017-02-13 VITALS — BP 117/76 | HR 68

## 2017-02-13 DIAGNOSIS — M722 Plantar fascial fibromatosis: Secondary | ICD-10-CM

## 2017-02-13 DIAGNOSIS — M79672 Pain in left foot: Secondary | ICD-10-CM

## 2017-02-13 MED ORDER — MELOXICAM 15 MG PO TABS: 15.0000 mg | ORAL_TABLET | Freq: Every day | ORAL | 0 refills | Status: DC

## 2017-02-13 MED ORDER — METHYLPREDNISOLONE 4 MG PO TBPK: ORAL_TABLET | ORAL | 0 refills | Status: DC

## 2017-02-13 MED FILL — MELOXICAM 15 MG TABLET: 15 | 30 days supply | Qty: 30 | Fill #0

## 2017-02-13 MED FILL — METHYLPREDNISOLONE 4 MG TAB: 4 | 6 days supply | Qty: 21 | Fill #0

## 2017-02-13 NOTE — Patient Instructions (Addendum)
Plantar Fasciitis Rehab Ask your health care provider which exercises are safe for you. Do exercises exactly as told by your health care provider and adjust them as directed. It is normal to feel mild stretching, pulling, tightness, or discomfort as you do these exercises, but you should stop right away if you feel sudden pain or your pain gets worse. Do not begin these exercises until told by your health care provider. Stretching and range of motion exercises These exercises warm up your muscles and joints and improve the movement and flexibility of your foot. These exercises also help to relieve pain. Exercise A: Plantar fascia stretch   1. Sit with your left / right leg crossed over your opposite knee. 2. Hold your heel with one hand with that thumb near your arch. With your other hand, hold your toes and gently pull them back toward the top of your foot. You should feel a stretch on the bottom of your toes or your foot or both. 3. Hold this stretch for__________ seconds. 4. Slowly release your toes and return to the starting position. Repeat __________ times. Complete this exercise __________ times a day. Exercise B: Gastroc, standing   1. Stand with your hands against a wall. 2. Extend your left / right leg behind you, and bend your front knee slightly. 3. Keeping your heels on the floor and keeping your back knee straight, shift your weight toward the wall without arching your back. You should feel a gentle stretch in your left / right calf. 4. Hold this position for __________ seconds. Repeat __________ times. Complete this exercise __________ times a day. Exercise C: Soleus, standing  1. Stand with your hands against a wall. 2. Extend your left / right leg behind you, and bend your front knee slightly. 3. Keeping your heels on the floor, bend your back knee and slightly shift your weight over the back leg. You should feel a gentle stretch deep in your calf. 4. Hold this position for  __________ seconds. Repeat __________ times. Complete this exercise __________ times a day. Exercise D: Gastrocsoleus, standing  1. Stand with the ball of your left / right foot on a step. The ball of your foot is on the walking surface, right under your toes. 2. Keep your other foot firmly on the same step. 3. Hold onto the wall or a railing for balance. 4. Slowly lift your other foot, allowing your body weight to press your heel down over the edge of the step. You should feel a stretch in your left / right calf. 5. Hold this position for __________ seconds. 6. Return both feet to the step. 7. Repeat this exercise with a slight bend in your left / right knee. Repeat __________ times with your left / right knee straight and __________ times with your left / right knee bent. Complete this exercise __________ times a day. Balance exercise This exercise builds your balance and strength control of your arch to help take pressure off your plantar fascia. Exercise E: Single leg stand  1. Without shoes, stand near a railing or in a doorway. You may hold onto the railing or door frame as needed. 2. Stand on your left / right foot. Keep your big toe down on the floor and try to keep your arch lifted. Do not let your foot roll inward. 3. Hold this position for __________ seconds. 4. If this exercise is too easy, you can try it with your eyes closed or while standing on a   pillow. Repeat __________ times. Complete this exercise __________ times a day. This information is not intended to replace advice given to you by your health care provider. Make sure you discuss any questions you have with your health care provider. Document Released: 09/04/2005 Document Revised: 05/09/2016 Document Reviewed: 07/19/2015 Elsevier Interactive Patient Education  2017 [employer].  For tennis shoes recommend:  Kandy Garrison Ascis New balance Saucony Can be purchased at [employer] sports or [employer]  Vionic  SAS Can  be purchased at [employer] or [employer]   For work shoes recommend: [employer] Work [employer]  Can be purchased at a variety of places or Engineer, maintenance ([employer])   For casual shoes recommend: Vionic  Can be purchased at [employer] or [place]- [employer] or [employer]

## 2017-02-13 NOTE — Progress Notes (Signed)
   Subjective:    Patient ID: Erika Tate, female    DOB: February 23, 1966, 51 y.o.   MRN: [id number]  HPI    Review of Systems  Musculoskeletal: Positive for gait problem.  Neurological: Positive for headaches.  All other systems reviewed and are negative.      Objective:   Physical Exam        Assessment & Plan:

## 2017-02-13 NOTE — Progress Notes (Signed)
Subjective: Erika Tate is a 51 y.o. female patient presents to office with complaint of heel pain on the left. Patient admits to post static dyskinesia for 1 month in duration. Patient has treated this problem with stretching, icing, change in shoes with no relief. Denies any other pedal complaints.   There are no active problems to display for this patient.   Current Outpatient Prescriptions on File Prior to Visit  Medication Sig Dispense Refill  . fluticasone (FLONASE) 50 MCG/ACT nasal spray Place 2 sprays into the nose daily. 16 g 0  . ibuprofen (ADVIL,MOTRIN) 600 MG tablet Take 600 mg by mouth every 6 (six) hours as needed.    . [DISCONTINUED] loratadine (CLARITIN) 10 MG tablet Take 10 mg by mouth daily.     No current facility-administered medications on file prior to visit.     Allergies  Allergen Reactions  . Aspirin Other (See Comments)    GI intolerance    Objective: Physical Exam General: The patient is alert and oriented x3 in no acute distress.  Dermatology: Skin is warm, dry and supple bilateral lower extremities. Nails 1-10 are normal. There is no erythema, edema, no eccymosis, no open lesions present. Integument is otherwise unremarkable.  Vascular: Dorsalis Pedis pulse and Posterior Tibial pulse are 2/4 bilateral. Capillary fill time is immediate to all digits.  Neurological: Grossly intact to light touch with an achilles reflex of +2/5 and a  negative Tinel's sign bilateral.  Musculoskeletal: Tenderness to palpation at the medial calcaneal tubercale and through the insertion of the plantar fascia on the left foot. No pain with compression of calcaneus bilateral. No pain with tuning fork to calcaneus bilateral. No pain with calf compression bilateral. There is decreased Ankle joint range of motion bilateral. All other joints range of motion within normal limits bilateral. Pes planus. Strength 5/5 in all groups bilateral.   Gait: Unassisted, Antalgic avoid  weight on left/right heel  Xray,Left foot:  Normal osseous mineralization. Joint spaces preserved. No fracture/dislocation/boney destruction. Calcaneal spur present with mild thickening of plantar fascia. No other soft tissue abnormalities or radiopaque foreign bodies.   Assessment and Plan: Problem List Items Addressed This Visit    None    Visit Diagnoses    Plantar fasciitis of left foot    -  Primary   Relevant Medications   methylPREDNISolone (MEDROL DOSEPAK) 4 MG TBPK tablet   meloxicam (MOBIC) 15 MG tablet   Other Relevant Orders   DG Foot Complete Left (Completed)   Left foot pain          -Complete examination performed.  -Xrays reviewed -Discussed with patient in detail the condition of plantar fasciitis, how this occurs and general treatment options. Explained both conservative and surgical treatments.  -Patient declined injection  -Recommended good supportive shoes and advised use of OTC insert. Explained to patient that if these orthoses work well, we will continue with these. If these do not improve his/her condition and  pain, we will consider custom molded orthoses. - Explained in detail the use of the fascial brace for left which was dispensed at today's visit. -Explained and dispensed to patient daily stretching exercises. -Recommend patient to ice affected area 1-2x daily. -Patient to return to office in 4 weeks for follow up or sooner if problems or questions arise.  Landis Martins, DPM

## 2017-02-20 DIAGNOSIS — J209 Acute bronchitis, unspecified: Secondary | ICD-10-CM | POA: Diagnosis not present

## 2017-02-20 DIAGNOSIS — J028 Acute pharyngitis due to other specified organisms: Secondary | ICD-10-CM | POA: Diagnosis not present

## 2017-02-20 DIAGNOSIS — R05 Cough: Secondary | ICD-10-CM | POA: Diagnosis not present

## 2017-02-20 MED FILL — HYDROCODONE-CHLORPHENIRAM S: 10-8 | 10 days supply | Qty: 100 | Fill #0

## 2017-02-20 MED FILL — AZITHROMYCIN 250 MG TAB: 250 | 5 days supply | Qty: 6 | Fill #0

## 2017-03-13 ENCOUNTER — Encounter: Payer: Self-pay | Admitting: Dietician

## 2017-03-13 ENCOUNTER — Encounter: Payer: 59 | Attending: Internal Medicine | Admitting: Dietician

## 2017-03-13 DIAGNOSIS — Z713 Dietary counseling and surveillance: Secondary | ICD-10-CM | POA: Diagnosis not present

## 2017-03-13 DIAGNOSIS — Z6827 Body mass index (BMI) 27.0-27.9, adult: Secondary | ICD-10-CM | POA: Diagnosis not present

## 2017-03-13 NOTE — Progress Notes (Signed)
  Medical Nutrition Therapy:  Appt start time: 6333 end time:  5456.   Assessment:  Primary concerns today: Patient Patient is here alone.  She wants to learn more about good nutrition and tips to lose weight.  She is going to work with a Clinical research associate as well.  Hx includes HTN and vitamin D deficiency.  She is perimenopausal.  She reports issues with stress eating, eating late, increased high fat snacks, and comfort foods.  Weight hx: 130 lbs lowest adult weight 130-140 lbs until after last child and started to gain at that time.  (7 years ago). 158 lbs 3 weeks ago, highest adult weight 156 lbs today  She lives with her husband and children ages 28 and 42.  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  Preferred Learning Style:   No preference indicated   Learning Readiness:   Ready  Change in progress   MEDICATIONS: see list to include vitamin D   DIETARY INTAKE:  Usual eating pattern includes 3 meals and 3 snacks per day.  24-hr recall:  B ( AM): instant apple cranberry oatmeal OR berry Special K cereal and 2% milk   Snk (10-11 AM): yoplait yogurt OR apple OR sweet and salty granola bar  L ( PM): Sometimes [facility] cafeteria (Meat, vegetable, starch, cookie or cake) OR frozen meal OR Subway sandwich and chips, fruit cup Snk ( PM): cheez-its D ( PM): spaghetti, salad, cheese toast OR broccoli casserole, ribs, cornbread, potato salad Snk ( PM): ice cream or popcorn or cookies or yogurt Beverages: water or sweet tea, hot cider in the winter  Usual physical activity: Starting to work with a trainer once a week and then do the program 3 times total per week for 1 hour each.       Estimated energy needs: 1600 calories 180 g carbohydrates 120 g protein 44 g fat  Progress Towards Goal(s):  In progress.   Nutritional Diagnosis:  NB-1.1 Food and nutrition-related knowledge deficit As related to healthy eating.  As evidenced by patient report.    Intervention:   Nutrition counseling/education related to mindful eating. Be active. Be mindful about your food choices, portion sizes, etc.  Snacking:  Are you hungry? Or eating for another reason?  Eat away from distractions such as TV, computers, phones, etc.  Sit when you eat.  Relax and eat slowly.  Teaching Method Utilized:  Visual Auditory Hands on  Handouts given during visit include:  Snack sheet  Meal plan card  My plate  Barriers to learning/adherence to lifestyle change: time  Demonstrated degree of understanding via:  Teach Back   Monitoring/Evaluation:  Dietary intake, exercise, label reading, and body weight in 1 month(s).

## 2017-03-13 NOTE — Patient Instructions (Signed)
Be active. Be mindful about your food choices, portion sizes, etc.  Snacking:  Are you hungry? Or eating for another reason?  Eat away from distractions such as TV, computers, phones, etc.  Sit when you eat.  Relax and eat slowly.

## 2017-03-20 ENCOUNTER — Ambulatory Visit: Payer: 59 | Admitting: Sports Medicine

## 2017-03-29 DIAGNOSIS — J0101 Acute recurrent maxillary sinusitis: Secondary | ICD-10-CM | POA: Diagnosis not present

## 2017-03-29 MED FILL — AMOX TR-K CLV 875-125 MG TA: 875-125 | 10 days supply | Qty: 20 | Fill #0

## 2017-04-09 ENCOUNTER — Ambulatory Visit: Payer: 59 | Admitting: Dietician

## 2017-04-27 DIAGNOSIS — F40248 Other situational type phobia: Secondary | ICD-10-CM | POA: Diagnosis not present

## 2017-04-30 MED FILL — LISINOPRIL-HCTZ 10-12.5 MG: 10-12.5 | 90 days supply | Qty: 90 | Fill #0

## 2017-05-01 ENCOUNTER — Ambulatory Visit: Payer: 59 | Admitting: Dietician

## 2017-05-15 DIAGNOSIS — I493 Ventricular premature depolarization: Secondary | ICD-10-CM | POA: Diagnosis not present

## 2017-05-15 DIAGNOSIS — I10 Essential (primary) hypertension: Secondary | ICD-10-CM | POA: Diagnosis not present

## 2017-05-15 DIAGNOSIS — E559 Vitamin D deficiency, unspecified: Secondary | ICD-10-CM | POA: Diagnosis not present

## 2017-05-17 DIAGNOSIS — R7309 Other abnormal glucose: Secondary | ICD-10-CM | POA: Diagnosis not present

## 2017-05-30 DIAGNOSIS — E663 Overweight: Secondary | ICD-10-CM | POA: Diagnosis not present

## 2017-05-30 DIAGNOSIS — R600 Localized edema: Secondary | ICD-10-CM | POA: Diagnosis not present

## 2017-05-30 DIAGNOSIS — E559 Vitamin D deficiency, unspecified: Secondary | ICD-10-CM | POA: Diagnosis not present

## 2017-05-30 DIAGNOSIS — R7303 Prediabetes: Secondary | ICD-10-CM | POA: Diagnosis not present

## 2017-05-30 DIAGNOSIS — Z6828 Body mass index (BMI) 28.0-28.9, adult: Secondary | ICD-10-CM | POA: Diagnosis not present

## 2017-06-04 ENCOUNTER — Encounter: Payer: 59 | Attending: Internal Medicine | Admitting: Dietician

## 2017-06-04 DIAGNOSIS — R7303 Prediabetes: Secondary | ICD-10-CM | POA: Insufficient documentation

## 2017-06-04 NOTE — Progress Notes (Addendum)
  Medical Nutrition Therapy:  Appt start time: 0355 end time:  1645.  Assessment:  Primary concerns today: patient is here for a follow up today alone.  She has now been diagnosed with prediabetes with an A1C of 6.4%.   Other hx includes HTN and vitamin D deficiency.  She is perimenopausal.  She reports issues with stress eating, eating late, increased high fat snacks, and comfort foods. She was last seen by me 03/13/17.  Weight hx: 130 lbs lowest adult weight 130-140 lbs until after last child and started to gain at that time.  (7 years ago). 60 highest adult weight  Patient lives with her husband and children ages 31 and 34.  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   TANITA  BODY COMP RESULTS 06/04/17 154.4 lbs   BMI (kg/m^2) 28.2   Fat Mass (lbs) 59.2   Fat Free Mass (lbs) 95.2   Total Body Water (lbs) 66.8    MEDICATIONS: see list to include vitamin D  DIETARY INTAKE:  24-hr recall:  B ( AM): special K berry cereal and milk  Snk (11 AM) : granola bar  L (12:15 PM): chicken tenders or meatballs and gravy or baked chicken, vegetable, rice or spaghetti and vegetables or chicken pot pie or pizza or ham sandwich and baked chips (leftovers from patient meals) Snk (PM): fruit cup or granola bar or baked chips or apple D (6 PM): meatloaf, rice, vegetable OR spaghetti with meat sauce, vegetable  Snk ( PM): occasional cake or fruit Beverages: unsweetened tea, water, 2% milk, OJ, rare regular soda  Recent physical activity: 20 minute video 4 days per week  Estimated energy needs: 1600 calories 180 g carbohydrates 120 g protein 44 g fat  Progress Towards Goal(s):  In progress.   Nutritional Diagnosis:  NB-1.1 Food and nutrition-related knowledge deficit As related to prediabetes and weight management.  As evidenced by patient report.    Intervention:  Nutrition counseling/edcuation regarding prediabetes.  Discussed diagnosis criteria of diabetes and causes.   Discussed carbohydrates, food sources, and portion sizes.  Discussed basic meal planning.  Reviewed mindful eating. Discussed importance of increased activity.  Be mindful about your food choices, portion sizes, etc.             Snacking:  Are you hungry? Or eating for another reason?             Eat away from distractions such as TV, computers, phones, etc.             Sit when you eat.  Relax and eat slowly. Aim for 30 minutes of exercise 5 days per week. Bake or other low fat cooking methods.  Be mindful of added fats. Consider journaling.  (Calorie [employer] Pace app is a good resource)  Goals 1400 calories, <40 grams fat per day. Aim for 2-3 carb choices per meal and 0-1 per snack if you are hungry. (around 10 per day)   Handouts given during visit include:  Meal plan card  Snack list  My plate  Monitoring/Evaluation:  Dietary intake, exercise, and body weight in 1 month(s).

## 2017-06-04 NOTE — Patient Instructions (Addendum)
Be mindful about your food choices, portion sizes, etc.             Snacking:  Are you hungry? Or eating for another reason?             Eat away from distractions such as TV, computers, phones, etc.             Sit when you eat.  Relax and eat slowly. Aim for 30 minutes of exercise 5 days per week. Bake or other low fat cooking methods.  Be mindful of added fats. Consider journaling.  (Calorie [employer] Pace app is a good resource)  Goals 1400 calories, <40 grams fat per day. Aim for 2-3 carb choices per meal and 0-1 per snack if you are hungry. (around 10 per day)

## 2017-06-08 DIAGNOSIS — F40248 Other situational type phobia: Secondary | ICD-10-CM | POA: Diagnosis not present

## 2017-06-22 DIAGNOSIS — F40248 Other situational type phobia: Secondary | ICD-10-CM | POA: Diagnosis not present

## 2017-07-03 ENCOUNTER — Ambulatory Visit: Payer: 59 | Admitting: Dietician

## 2017-07-17 DIAGNOSIS — I10 Essential (primary) hypertension: Secondary | ICD-10-CM | POA: Diagnosis not present

## 2017-07-17 DIAGNOSIS — F418 Other specified anxiety disorders: Secondary | ICD-10-CM | POA: Diagnosis not present

## 2017-07-17 MED FILL — HYDROCHLOROTHIAZIDE 12.5 MG: 12.5 | 90 days supply | Qty: 90 | Fill #0

## 2017-07-17 MED FILL — ATENOLOL 25 MG TABLET: 25 | 90 days supply | Qty: 45 | Fill #0

## 2017-07-31 ENCOUNTER — Encounter: Payer: 59 | Attending: Internal Medicine | Admitting: Dietician

## 2017-07-31 ENCOUNTER — Ambulatory Visit: Payer: 59 | Admitting: Sports Medicine

## 2017-07-31 ENCOUNTER — Encounter: Payer: Self-pay | Admitting: Sports Medicine

## 2017-07-31 DIAGNOSIS — R7303 Prediabetes: Secondary | ICD-10-CM | POA: Insufficient documentation

## 2017-07-31 DIAGNOSIS — M79672 Pain in left foot: Secondary | ICD-10-CM

## 2017-07-31 DIAGNOSIS — M722 Plantar fascial fibromatosis: Secondary | ICD-10-CM | POA: Diagnosis not present

## 2017-07-31 DIAGNOSIS — L700 Acne vulgaris: Secondary | ICD-10-CM | POA: Diagnosis not present

## 2017-07-31 MED ORDER — TRIAMCINOLONE ACETONIDE 10 MG/ML IJ SUSP: 10.0000 mg | Freq: Once | INTRAMUSCULAR | Status: AC

## 2017-07-31 MED ORDER — MELOXICAM 15 MG PO TABS: 15.0000 mg | ORAL_TABLET | Freq: Every day | ORAL | 0 refills | Status: AC

## 2017-07-31 MED ORDER — METHYLPREDNISOLONE 4 MG PO TBPK: ORAL_TABLET | ORAL | 0 refills | Status: AC

## 2017-07-31 MED FILL — MELOXICAM 15 MG TABLET: 15 | 30 days supply | Qty: 30 | Fill #0

## 2017-07-31 MED FILL — METHYLPREDNISOLONE 4 MG TAB: 4 | 6 days supply | Qty: 21 | Fill #0

## 2017-07-31 MED FILL — CLINDAMYCIN PHOS-BENZOYL PE: 1-5 | 30 days supply | Qty: 50 | Fill #0

## 2017-07-31 NOTE — Progress Notes (Signed)
  Medical Nutrition Therapy:  Appt start time: 1630 end time:  1700.  Assessment:  Primary concerns today: Patient is here today alone for follow up of prediabetes. A1C 6.4% per patient at 05/2017 visit.  Other history includes HTN and vitamin D deficiency.  She is perimenopausal.  She reports continued issues with stress and comfort eating.  She has had increased problems with Plantar Fascitis which has interfered with exercise.  Last visit with me was 06/04/17.  Overall weight is not changed.  Weight hx: 130 lbs lowest adult weight 130-140 lbs until after last child and started to gain at that time. (7 years ago). 61 highest adult weight  Patient lives with her husband and children ages 35 and 14.  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  TANITA  BODY COMP RESULTS 08/01/17 157.8 lbs   BMI (kg/m^2) 28.9   Fat Mass (lbs) 60.8   Fat Free Mass (lbs) 97   Total Body Water (lbs) 68.2   MEDICATIONS: see list to include Vitamin D.  DIETARY INTAKE:  24-hr recall:  B (6:30 AM): special K berry cereal and 1/2 cup 2% milk  Snk (10 AM) :greek yogurt  L (12 PM): baked spaghetti, brussel sprouts today OR [facility] cafeteria OR catered food Snk ( PM): 100 calorie cookies D ( PM): cubed steak, gravy, brown rice, peas  Snk ( PM): fruit or greek yogurt Beverages: water, half and half sweet tea, 2% milk  Recent physical activity: ADL's  Estimated energy needs: 1600 calories 180 g carbohydrates 120 g protein 44 g fat  Progress Towards Goal(s):  In progress.   Nutritional Diagnosis:  NB-1.1 Food and nutrition-related knowledge deficit As related to balance of carbohydrate, protein, and fat.  As evidenced by diet hx.    Intervention:  Nutrition counseling/edcuation related to mindful eating, prediabetes education.  Discussed hindrances to healthy eating and exercise as well as alternative options such as use of pool, planning ahead, making choices.  Continue to be mindful about  your food choices.  Stop when you are satisfied. Food choices begin in the grocery store.  Resume activity when you are able.  Handouts given during visit include:  Meal plan card  Monitoring/Evaluation:  Dietary intake, exercise, label reading, and body weight in 4 month(s).

## 2017-07-31 NOTE — Progress Notes (Signed)
Subjective: Erika Tate is a 51 y.o. female patient returns to office for follow up of heel pain on the left. Patient admits to post static dyskinesia and continued pain that is now 5/10 with Mobic however has had a flare up 10/10. Denies any other pedal complaints.   There are no active problems to display for this patient.   Current Outpatient Medications on File Prior to Visit  Medication Sig Dispense Refill  . atenolol (TENORMIN) 25 MG tablet Take 25 mg by mouth. Pt takes 1/2 tablet every day    . cholecalciferol (VITAMIN D) 1000 units tablet Take 1,000 Units by mouth daily.    . hydrochlorothiazide (MICROZIDE) 12.5 MG capsule     . ibuprofen (ADVIL,MOTRIN) 600 MG tablet Take 600 mg by mouth every 6 (six) hours as needed.    Marland Kitchen lisinopril-hydrochlorothiazide (PRINZIDE,ZESTORETIC) 10-12.5 MG tablet Take 1 tablet by mouth daily.    . meloxicam (MOBIC) 15 MG tablet Take 1 tablet (15 mg total) by mouth daily. 30 tablet 0  . methylPREDNISolone (MEDROL DOSEPAK) 4 MG TBPK tablet Take 1st as instructed 21 tablet 0  . potassium chloride (K-DUR) 10 MEQ tablet Take 20 mEq by mouth daily.    . fluticasone (FLONASE) 50 MCG/ACT nasal spray Place 2 sprays into the nose daily. 16 g 0  . [DISCONTINUED] loratadine (CLARITIN) 10 MG tablet Take 10 mg by mouth daily.     No current facility-administered medications on file prior to visit.     Allergies  Allergen Reactions  . Aspirin Other (See Comments)    GI intolerance    Objective: Physical Exam General: The patient is alert and oriented x3 in no acute distress.  Dermatology: Skin is warm, dry and supple bilateral lower extremities. Nails 1-10 are normal. There is no erythema, edema, no eccymosis, no open lesions present. Integument is otherwise unremarkable.  Vascular: Dorsalis Pedis pulse and Posterior Tibial pulse are 2/4 bilateral. Capillary fill time is immediate to all digits.  Neurological: Grossly intact to light touch with an  achilles reflex of +2/5 and a  negative Tinel's sign bilateral.  Musculoskeletal: Tenderness to palpation at the medial calcaneal tubercale and through the insertion of the plantar fascia on the left foot. No pain with compression of calcaneus bilateral. No pain with tuning fork to calcaneus bilateral. No pain with calf compression bilateral. There is decreased Ankle joint range of motion bilateral. All other joints range of motion within normal limits bilateral. Pes planus. Strength 5/5 in all groups bilateral.   Assessment and Plan: Problem List Items Addressed This Visit    None    Visit Diagnoses    Plantar fasciitis of left foot    -  Primary   Left foot pain          -Complete examination performed.  -Previous rays reviewed -Re-Discussed with patient in detail the condition of plantar fasciitis, how this occurs and general treatment options. Explained both conservative and surgical treatments.  -After oral consent and aseptic prep, injected a mixture containing 1 ml of 2%  plain lidocaine, 1 ml 0.5% plain marcaine, 0.5 ml of kenalog 10 and 0.5 ml of dexamethasone phosphate into Left heel at fascia insertion without complication. Post-injection care discussed with patient.  -Refilled Medrol dosepak and Mobic to take as instructed  -Recommend continue good supportive shoes and advised use of OTC insert. Explained to patient that if these orthoses work well, we will continue with these. If these do not improve her condition and  pain, we will consider custom molded orthoses. -Continue with fascial brace for left.  -Dispensed night splint for left to use as instructed with ice pack -Re-Explained and dispensed to patient daily stretching exercises. -Recommend patient to continue with  icing affected area 1-2x daily. -Patient to return to office in 6-8 weeks for follow up or sooner if problems or questions arise.  Landis Martins, DPM

## 2017-07-31 NOTE — Patient Instructions (Signed)

## 2017-07-31 NOTE — Patient Instructions (Signed)
Continue to be mindful about your food choices.  Stop when you are satisfied. Food choices begin in the grocery store.  Resume activity when you are able.

## 2017-08-01 MED FILL — TRETINOIN 0.05% CREAM: 0.05 | 30 days supply | Qty: 45 | Fill #0

## 2017-08-22 MED FILL — POTASSIUM CL ER 20 MEQ TAB: 20 | 90 days supply | Qty: 90 | Fill #1

## 2017-09-19 ENCOUNTER — Other Ambulatory Visit: Payer: Self-pay | Admitting: Obstetrics and Gynecology

## 2017-09-19 DIAGNOSIS — Z1231 Encounter for screening mammogram for malignant neoplasm of breast: Secondary | ICD-10-CM

## 2017-09-25 ENCOUNTER — Ambulatory Visit: Payer: 59 | Admitting: Sports Medicine

## 2017-10-09 MED FILL — HYDROCHLOROTHIAZIDE 12.5 MG: 12.5 | 90 days supply | Qty: 90 | Fill #0

## 2017-10-09 MED FILL — ATENOLOL 25 MG TABLET: 25 | 90 days supply | Qty: 45 | Fill #0

## 2017-10-10 ENCOUNTER — Other Ambulatory Visit: Payer: Self-pay | Admitting: Obstetrics and Gynecology

## 2017-10-10 ENCOUNTER — Ambulatory Visit
Admission: RE | Admit: 2017-10-10 | Discharge: 2017-10-10 | Disposition: A | Discharge status: Home / Self Care | Payer: 59 | Source: Ambulatory Visit | Attending: Obstetrics and Gynecology | Admitting: Obstetrics and Gynecology

## 2017-10-10 DIAGNOSIS — Z1231 Encounter for screening mammogram for malignant neoplasm of breast: Secondary | ICD-10-CM

## 2017-10-12 ENCOUNTER — Other Ambulatory Visit: Payer: Self-pay | Admitting: Obstetrics and Gynecology

## 2017-10-12 DIAGNOSIS — R928 Other abnormal and inconclusive findings on diagnostic imaging of breast: Secondary | ICD-10-CM

## 2017-10-17 ENCOUNTER — Ambulatory Visit
Admission: RE | Admit: 2017-10-17 | Discharge: 2017-10-17 | Disposition: A | Discharge status: Home / Self Care | Payer: No Typology Code available for payment source | Source: Ambulatory Visit | Attending: Obstetrics and Gynecology | Admitting: Obstetrics and Gynecology

## 2017-10-17 ENCOUNTER — Other Ambulatory Visit: Payer: Self-pay | Admitting: Obstetrics and Gynecology

## 2017-10-17 DIAGNOSIS — R928 Other abnormal and inconclusive findings on diagnostic imaging of breast: Secondary | ICD-10-CM

## 2017-11-11 IMAGING — MG 2D DIGITAL SCREENING BILATERAL MAMMOGRAM WITH CAD AND ADJUNCT TO
9 of 12 series · 9 of 28 positions shown · non-contrast
Comparison: Previous exam(s).

CLINICAL DATA: Screening.

EXAM:
2D DIGITAL SCREENING BILATERAL MAMMOGRAM WITH CAD AND ADJUNCT TOMO

[L MLO]
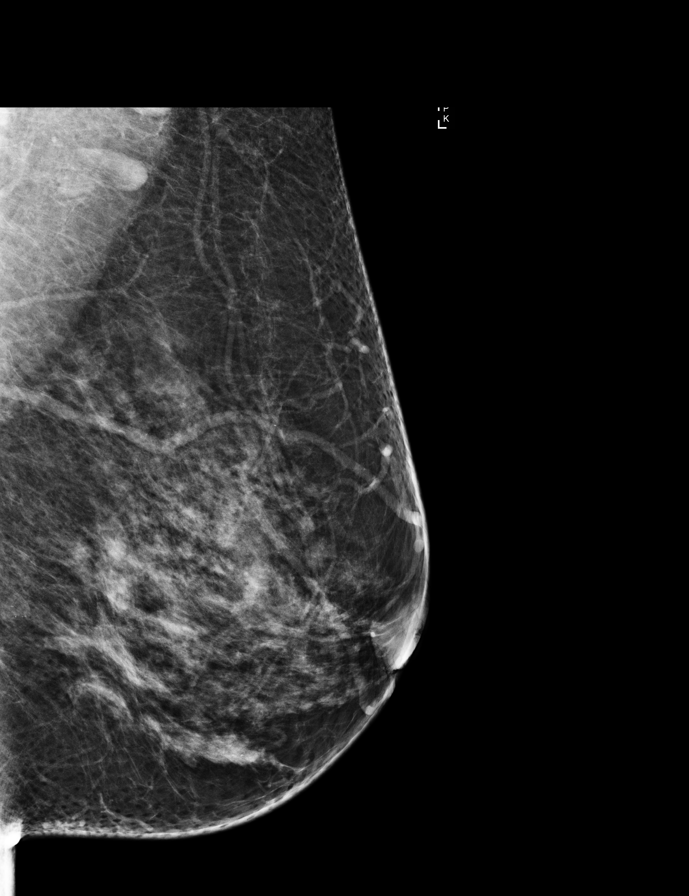

[R MLO synth-2D]
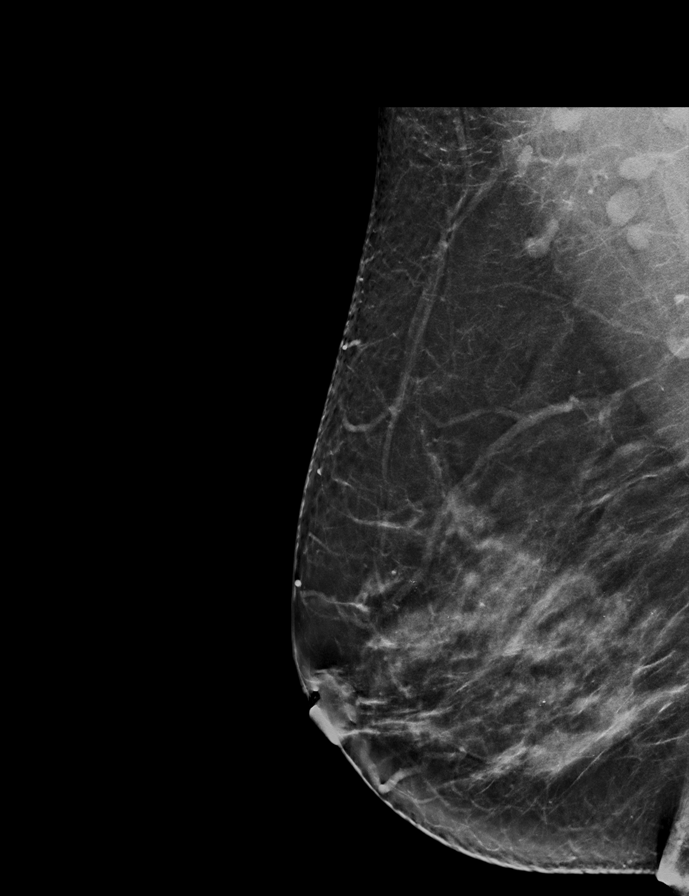

[L CC synth-2D]
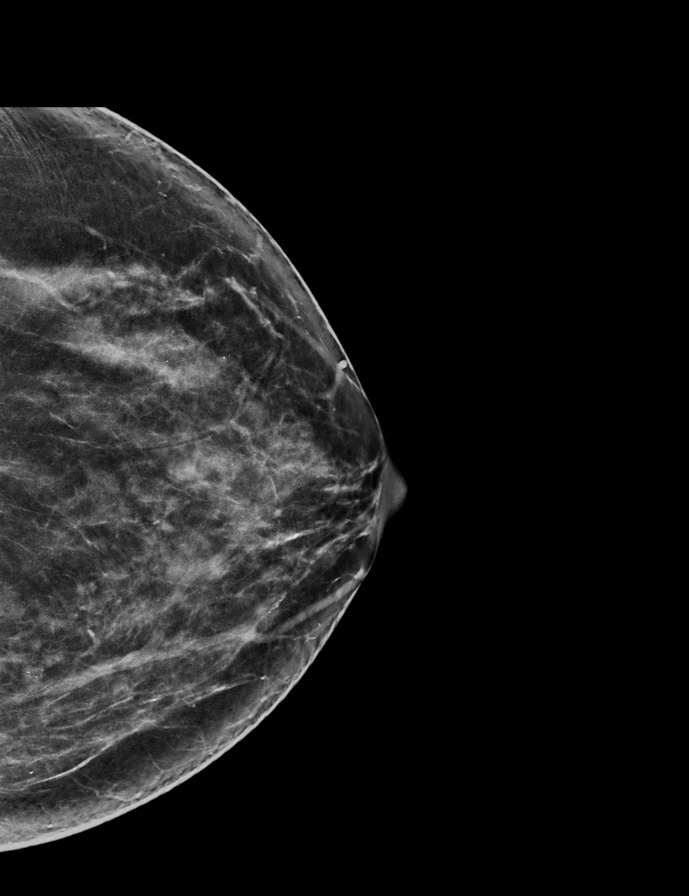

[L CC]
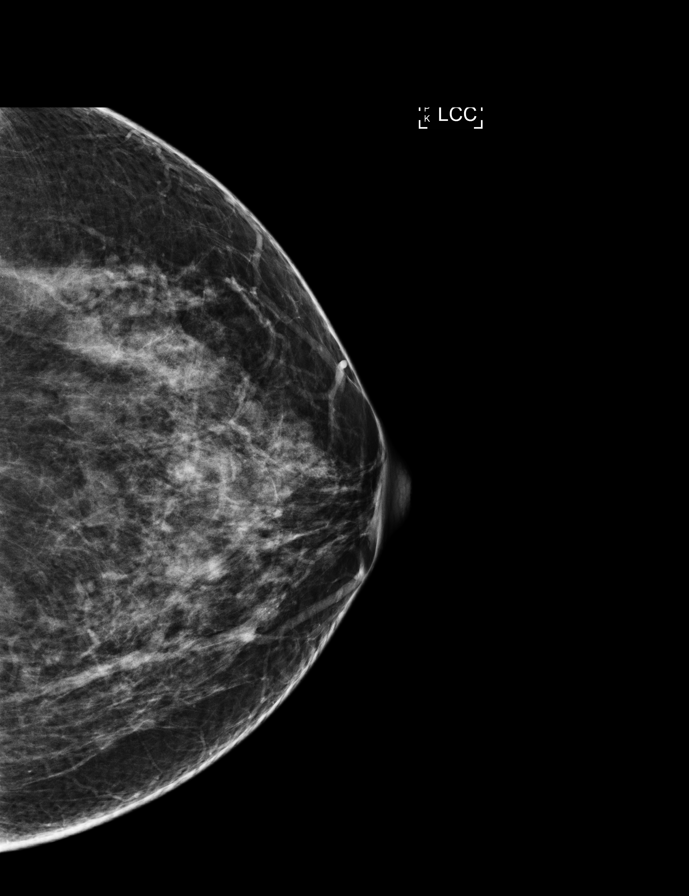

[R CC]
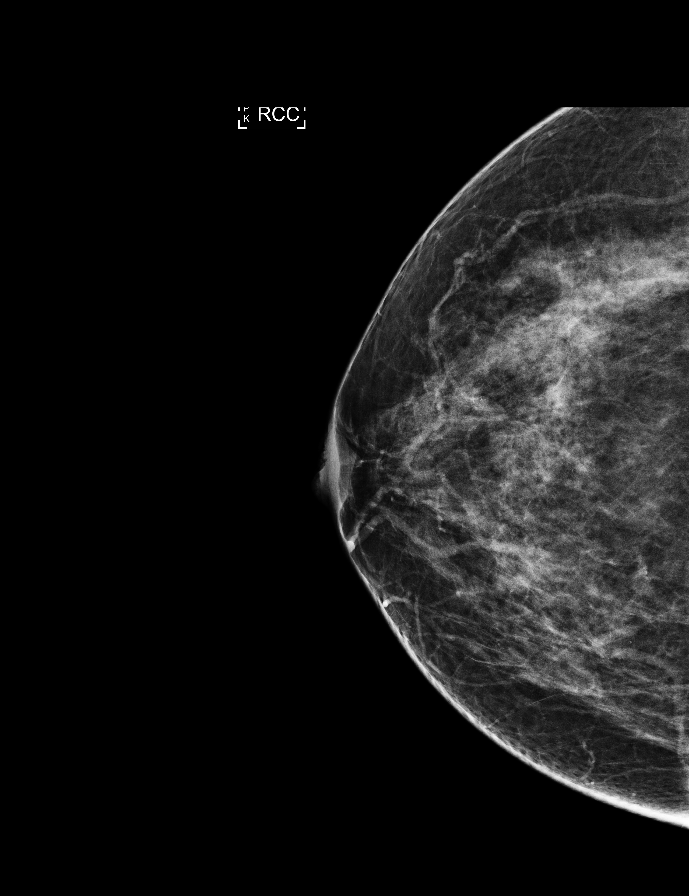

[L MLO synth-2D]
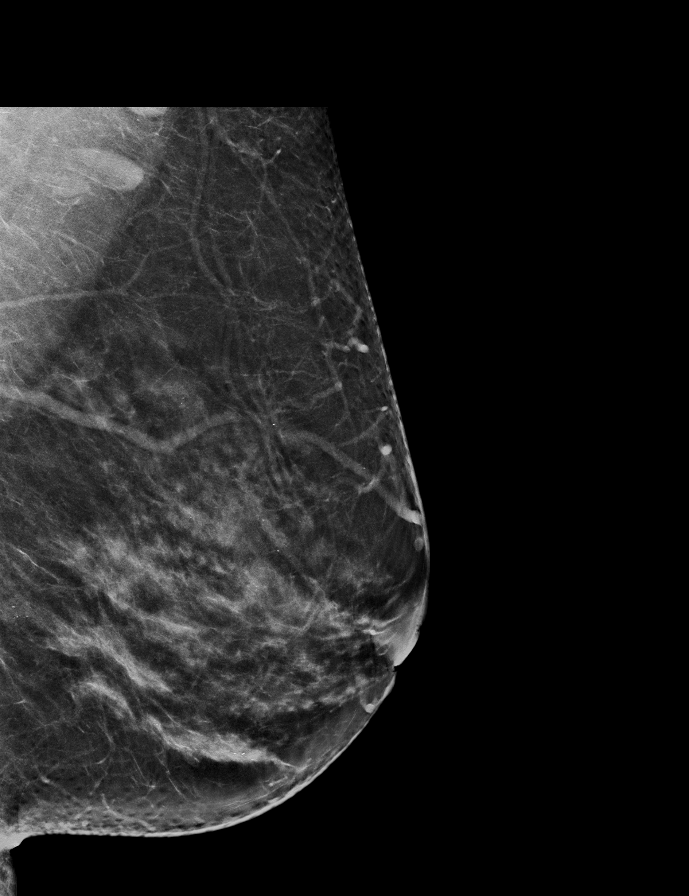

[R CC synth-2D]
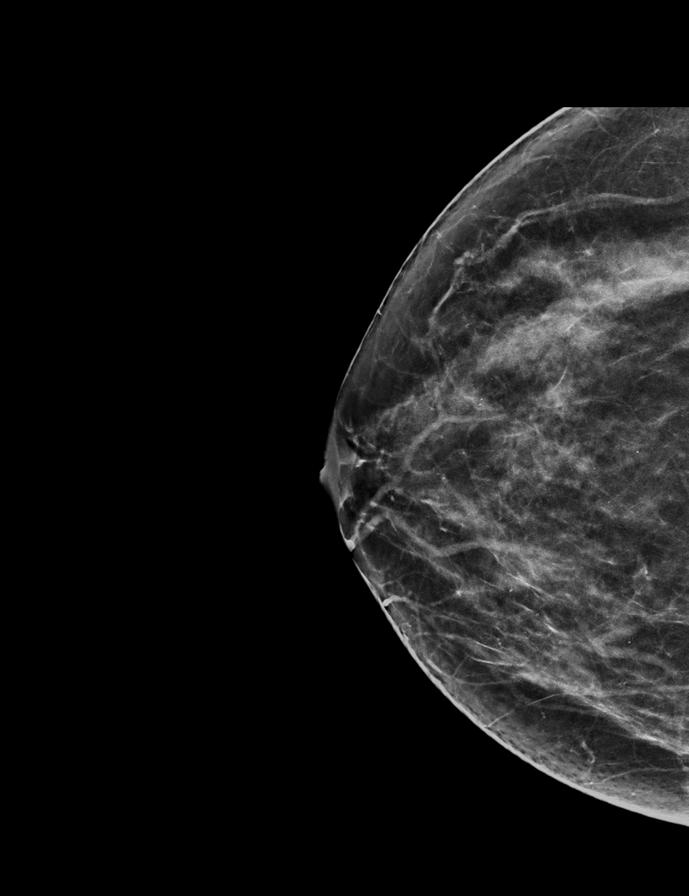

[R MLO]
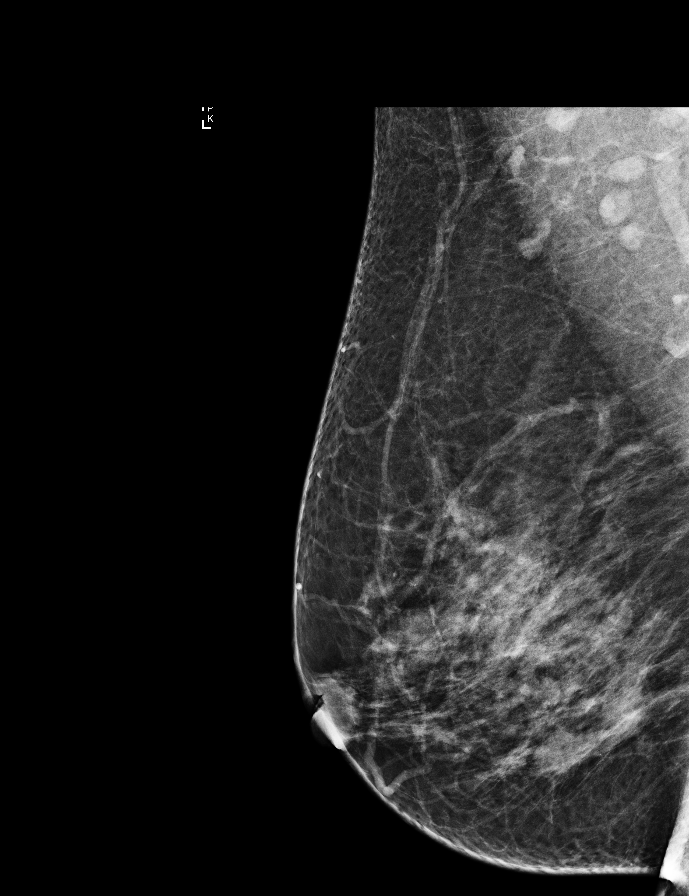

[L CC tomo · tomo slice 34/67.0]
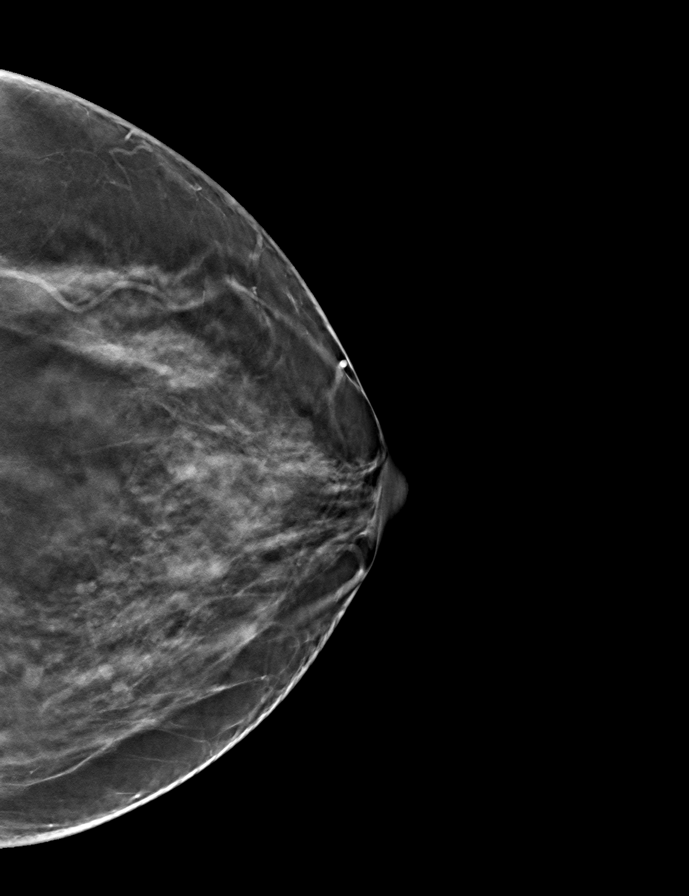

[9 of 28 positions shown; findings below may reference images not displayed]

ACR Breast Density Category c: The breast tissue is heterogeneously
dense, which may obscure small masses.
FINDINGS: In the left breast, a possible mass with calcifications warrants
further evaluation. In the right breast, no findings suspicious for
malignancy.

Images were processed with CAD.
IMPRESSION: Further evaluation is suggested for a possible mass with
calcifications in the left breast.

RECOMMENDATION:
3D diagnostic mammogram and possibly ultrasound of the left breast.
(Code:[id number]-[id number])

The patient will be contacted regarding the findings, and additional
imaging will be scheduled.

BI-RADS CATEGORY  0: Incomplete. Need additional imaging evaluation
and/or prior mammograms for comparison.

## 2017-11-27 ENCOUNTER — Ambulatory Visit: Payer: 59 | Admitting: Dietician

## 2018-01-04 MED FILL — HYDROCHLOROTHIAZIDE 12.5 MG: 12.5 | 90 days supply | Qty: 90 | Fill #1

## 2018-01-04 MED FILL — ATENOLOL 25 MG TABLET: 25 | 90 days supply | Qty: 45 | Fill #1

## 2018-04-03 MED FILL — HYDROCHLOROTHIAZIDE 12.5 MG: 12.5 | 90 days supply | Qty: 90 | Fill #2

## 2018-04-03 MED FILL — ATENOLOL 25 MG TABLET: 25 | 90 days supply | Qty: 45 | Fill #2

## 2018-04-19 ENCOUNTER — Ambulatory Visit
Admission: RE | Admit: 2018-04-19 | Discharge: 2018-04-19 | Disposition: A | Discharge status: Home / Self Care | Payer: No Typology Code available for payment source | Source: Ambulatory Visit | Attending: Obstetrics and Gynecology | Admitting: Obstetrics and Gynecology

## 2018-04-19 DIAGNOSIS — R928 Other abnormal and inconclusive findings on diagnostic imaging of breast: Secondary | ICD-10-CM

## 2018-06-07 ENCOUNTER — Encounter: Payer: Self-pay | Admitting: Family Medicine

## 2018-06-07 ENCOUNTER — Ambulatory Visit (INDEPENDENT_AMBULATORY_CARE_PROVIDER_SITE_OTHER): Payer: Self-pay | Admitting: Family Medicine

## 2018-06-07 VITALS — BP 116/82 | HR 79 | Temp 98.4°F | Resp 19 | Wt 168.2 lb

## 2018-06-07 DIAGNOSIS — R0981 Nasal congestion: Secondary | ICD-10-CM

## 2018-06-07 DIAGNOSIS — Z8709 Personal history of other diseases of the respiratory system: Secondary | ICD-10-CM

## 2018-06-07 MED ORDER — FLUTICASONE PROPIONATE 50 MCG/ACT NA SUSP: 2.0000 | Freq: Every day | NASAL | 0 refills | Status: AC

## 2018-06-07 MED ORDER — PSEUDOEPH-BROMPHEN-DM 30-2-10 MG/5ML PO SYRP: 10.0000 mL | ORAL_SOLUTION | Freq: Three times a day (TID) | ORAL | 0 refills | Status: AC | PRN

## 2018-06-07 MED ORDER — IPRATROPIUM BROMIDE 0.06 % NA SOLN: 2.0000 | Freq: Three times a day (TID) | NASAL | 0 refills | Status: DC

## 2018-06-07 MED FILL — IPRATROPIUM 0.06% SPRAY: 0.06 | 14 days supply | Qty: 15 | Fill #0

## 2018-06-07 MED FILL — FLUTICASONE PROP 50 MCG SPR: 50 | 30 days supply | Qty: 16 | Fill #0

## 2018-06-07 MED FILL — BROMPHENIR-PSEUDOEPHED-DM S: 30-2-10 | 4 days supply | Qty: 120 | Fill #0

## 2018-06-07 NOTE — Progress Notes (Signed)
Erika Tate is a 52 y.o. female who presents today with concerns of sinus pain and congestion for the past 4 days. She reports daily use of Zyrtec and Flonase for seasonal allergies and using this without real relief.  Review of Systems  Constitutional: Negative for chills, fever and malaise/fatigue.  HENT: Positive for congestion, sinus pain and sore throat. Negative for ear discharge and ear pain.   Eyes: Negative.   Respiratory: Positive for cough. Negative for sputum production and shortness of breath.   Cardiovascular: Negative.  Negative for chest pain.  Gastrointestinal: Negative for abdominal pain, diarrhea, nausea and vomiting.  Genitourinary: Negative for dysuria, frequency, hematuria and urgency.  Musculoskeletal: Negative for myalgias.  Skin: Negative.   Neurological: Negative for headaches.  Endo/Heme/Allergies: Negative.   Psychiatric/Behavioral: Negative.     O: Vitals:   06/07/18 0815  BP: 116/82  Pulse: 79  Resp: 19  Temp: 98.4 F (36.9 C)  SpO2: 99%     Physical Exam  Constitutional: She is oriented to person, place, and time. Vital signs are normal. She appears well-developed and well-nourished. She is active.  Non-toxic appearance. She does not have a sickly appearance.  HENT:  Head: Normocephalic.  Right Ear: Hearing, tympanic membrane, external ear and ear canal normal.  Left Ear: Hearing, tympanic membrane, external ear and ear canal normal.  Nose: Rhinorrhea present. Right sinus exhibits maxillary sinus tenderness. Left sinus exhibits maxillary sinus tenderness.  Mouth/Throat: Uvula is midline and oropharynx is clear and moist.  Neck: Normal range of motion. Neck supple.  Cardiovascular: Normal rate, regular rhythm, normal heart sounds and normal pulses.  Pulmonary/Chest: Effort normal and breath sounds normal.  Abdominal: Soft. Bowel sounds are normal.  Musculoskeletal: Normal range of motion.  Lymphadenopathy:       Head (right side): No  submental and no submandibular adenopathy present.       Head (left side): No submental and no submandibular adenopathy present.    She has no cervical adenopathy.  Neurological: She is alert and oriented to person, place, and time.  Psychiatric: She has a normal mood and affect.  Vitals reviewed.  A: 1. Sinus congestion   2. Hx of allergic rhinitis    P: Discussed exam findings, diagnosis etiology and medication use and indications reviewed with patient. Follow- Up and discharge instructions provided. No emergent/urgent issues found on exam.  Patient verbalized understanding of information provided and agrees with plan of care (POC), all questions answered.  Advised to follow up if fever develops and symptoms persist and are unimproved.  1. Sinus congestion - ipratropium (ATROVENT) 0.06 % nasal spray; Place 2 sprays into both nostrils 3 (three) times daily. - brompheniramine-pseudoephedrine-DM 30-2-10 MG/5ML syrup; Take 10 mLs by mouth 3 (three) times daily as needed (with glass of water).  2. Hx of allergic rhinitis - fluticasone (FLONASE) 50 MCG/ACT nasal spray; Place 2 sprays into both nostrils daily.

## 2018-06-07 NOTE — Patient Instructions (Signed)

## 2018-06-14 ENCOUNTER — Ambulatory Visit (INDEPENDENT_AMBULATORY_CARE_PROVIDER_SITE_OTHER): Payer: Self-pay | Admitting: Family Medicine

## 2018-06-14 DIAGNOSIS — R059 Cough, unspecified: Secondary | ICD-10-CM

## 2018-06-14 DIAGNOSIS — R05 Cough: Secondary | ICD-10-CM

## 2018-06-14 DIAGNOSIS — J019 Acute sinusitis, unspecified: Secondary | ICD-10-CM

## 2018-06-14 MED ORDER — BENZONATATE 200 MG PO CAPS: 200.0000 mg | ORAL_CAPSULE | Freq: Three times a day (TID) | ORAL | 0 refills | Status: AC | PRN

## 2018-06-14 MED ORDER — AMOXICILLIN-POT CLAVULANATE 875-125 MG PO TABS: 1.0000 | ORAL_TABLET | Freq: Two times a day (BID) | ORAL | 0 refills | Status: AC

## 2018-06-14 MED FILL — BENZONATATE 200 MG CAPS: 200 | 7 days supply | Qty: 20 | Fill #0

## 2018-06-14 MED FILL — AMOX-CLAV 875-125 MG TABLET: 875-125 | 7 days supply | Qty: 14 | Fill #0

## 2018-06-14 NOTE — Patient Instructions (Signed)

## 2018-06-14 NOTE — Progress Notes (Signed)
Erika Tate is a 52 y.o. female who presents today with concerns of persistent reports of sinus congestion and pain. She was seen on 06/07/2018 and denies relief of symptoms. She denies any fever but reports cough, facial fullness, pressure behind the eyes. She has been taking medication previously prescribed as directed without any relief.   Review of Systems  Constitutional: Negative for chills, fever and malaise/fatigue.  HENT: Positive for congestion. Negative for ear discharge, ear pain, sinus pain and sore throat.   Eyes: Negative.   Respiratory: Positive for cough. Negative for sputum production and shortness of breath.   Cardiovascular: Negative.  Negative for chest pain.  Gastrointestinal: Negative for abdominal pain, diarrhea, nausea and vomiting.  Genitourinary: Negative for dysuria, frequency, hematuria and urgency.  Musculoskeletal: Negative for myalgias.  Skin: Negative.   Neurological: Negative for headaches.  Endo/Heme/Allergies: Negative.   Psychiatric/Behavioral: Negative.     O: Vitals:   06/14/18 0812  BP: 100/80  Pulse: 81  Temp: 98.7 F (37.1 C)  SpO2: 99%     Physical Exam  Constitutional: She is oriented to person, place, and time. Vital signs are normal. She appears well-developed and well-nourished. She is active.  Non-toxic appearance. She does not have a sickly appearance.  HENT:  Head: Normocephalic.  Right Ear: Hearing, tympanic membrane, external ear and ear canal normal.  Left Ear: Hearing, tympanic membrane, external ear and ear canal normal.  Nose: Right sinus exhibits frontal sinus tenderness. Left sinus exhibits frontal sinus tenderness.  Mouth/Throat: Uvula is midline and oropharynx is clear and moist.  Eyes: Conjunctivae, EOM and lids are normal.  Neck: Normal range of motion. Neck supple.  Cardiovascular: Normal rate, regular rhythm, normal heart sounds and normal pulses.  Pulmonary/Chest: Effort normal and breath sounds normal.   Abdominal: Soft. Bowel sounds are normal.  Musculoskeletal: Normal range of motion.  Lymphadenopathy:       Head (right side): No submental and no submandibular adenopathy present.       Head (left side): No submental and no submandibular adenopathy present.    She has no cervical adenopathy.  Neurological: She is alert and oriented to person, place, and time.  Psychiatric: She has a normal mood and affect.  Vitals reviewed.   A: 1. Cough   2. Acute non-recurrent sinusitis, unspecified location    P: Discussed exam findings, diagnosis etiology and medication use and indications reviewed with patient. Follow- Up and discharge instructions provided. No emergent/urgent issues found on exam.  Patient verbalized understanding of information provided and agrees with plan of care (POC), all questions answered.  PLAN< Will treat patient based on verbal report- exam was overall unremarkable outside of reports of facial pain with palpation and report of pressure behind the eyes. Patient reports that this is a condition that she experiences twice annually.  1. Cough - benzonatate (TESSALON) 200 MG capsule; Take 1 capsule (200 mg total) by mouth 3 (three) times daily as needed for cough (with full glass of water).  2. Acute non-recurrent sinusitis, unspecified location - amoxicillin-clavulanate (AUGMENTIN) 875-125 MG tablet; Take 1 tablet by mouth 2 (two) times daily for 7 days.

## 2018-06-17 ENCOUNTER — Telehealth: Payer: Self-pay

## 2018-06-17 NOTE — Telephone Encounter (Signed)
Courtesy call

## 2018-06-24 ENCOUNTER — Telehealth: Payer: Self-pay | Admitting: Family Medicine

## 2018-06-24 DIAGNOSIS — B373 Candidiasis of vulva and vagina: Secondary | ICD-10-CM

## 2018-06-24 DIAGNOSIS — B3731 Acute candidiasis of vulva and vagina: Secondary | ICD-10-CM

## 2018-06-24 MED ORDER — FLUCONAZOLE 150 MG PO TABS: 150.0000 mg | ORAL_TABLET | Freq: Once | ORAL | 0 refills | Status: AC

## 2018-06-24 MED FILL — FLUCONAZOLE 150 MG TABS: 150 | 1 days supply | Qty: 1 | Fill #0

## 2018-06-24 NOTE — Telephone Encounter (Signed)
Patient called requesting mediation for yeast vaginitis after antibiotic use- patient has trialed over the counter monistat with only minimal relief and is concerned that the persistence of the symptoms of itching and irritation may require an oral prescription treatment.  Single tablet diflucan called in.

## 2018-06-26 ENCOUNTER — Other Ambulatory Visit: Payer: Self-pay | Admitting: Nurse Practitioner

## 2018-06-26 ENCOUNTER — Telehealth: Payer: Self-pay

## 2018-06-26 MED ORDER — FLUCONAZOLE 150 MG PO TABS: 150.0000 mg | ORAL_TABLET | Freq: Once | ORAL | 0 refills | Status: AC

## 2018-06-26 NOTE — Progress Notes (Signed)
Patient called requesting an additional Diflucan as her yeast infection has not resolved.  Informed patient I will send additional 2 tablets to her preferred pharmacy.  Patient will need to follow up with PCP if symptoms do not improve.

## 2018-06-26 NOTE — Telephone Encounter (Signed)
Patient called and states she still has symptoms of the yeast infection and wanted to know if she can have another diflucan tablet or what to do.

## 2018-06-27 MED FILL — FLUCONAZOLE 150 MG TABS: 150 | 4 days supply | Qty: 2 | Fill #0

## 2018-07-02 MED FILL — ATENOLOL 25 MG TABLET: 25 | 90 days supply | Qty: 45 | Fill #3

## 2018-07-02 MED FILL — HYDROCHLOROTHIAZIDE 12.5 MG: 12.5 | 90 days supply | Qty: 90 | Fill #3

## 2018-08-01 ENCOUNTER — Encounter: Payer: No Typology Code available for payment source | Attending: Obstetrics and Gynecology | Admitting: Registered"

## 2018-08-01 ENCOUNTER — Encounter: Payer: Self-pay | Admitting: Registered"

## 2018-08-01 DIAGNOSIS — Z713 Dietary counseling and surveillance: Secondary | ICD-10-CM | POA: Insufficient documentation

## 2018-08-01 NOTE — Patient Instructions (Addendum)
-   Increase physical activity. Use [employer] App.   - Increase fiber intake by having non-starchy vegetables with lunch and dinner.   - Have balanced meals 3 times a day.   - Balance snack options with carbohydrate and protein.

## 2018-08-01 NOTE — Progress Notes (Signed)
  Medical Nutrition Therapy:  Appt start time: 3:20 end time:  4:11.  Prefers to be called "Erika Tate" Assessment:  Primary concerns today: wants to lose weight. Pt states she wants to lose weight because she thinks she will feel better, fit clothes that she already has instead of buying new ones. Pt states she wants to lose 20-30 lbs by age 52.   Pt expectations: what is good to eat as snacks, education on reading labels  Pt states she has prediabetes. Per lab results recent A1c 6.7 (03/2018). Pt states provider will put her on medicine if A1c continues to be higher.   Pt states she stress eats and learning to mindfully eat. Pt states she loves sweet tea, has been trying to do half sweet/half usweetened tea and it is going well. Pt states she does not drink sodas often. Pt states she used to work out with oldest son but things have been busy with schedule changes in her home.   Next visit: reading nutrition facts labels   Preferred Learning Style:   No preference indicated   Learning Readiness:   Ready  Change in progress   MEDICATIONS: See list   DIETARY INTAKE:  Usual eating pattern includes 3 meals and 2-3 snacks per day.  Everyday foods include cereal, yogurt, fruit, chicken, vegetables, beef and gravy, granola bars, and sweets.  Avoided foods include a lot of chocolate.    24-hr recall:  B ( AM): special K cereal or grits + yogurt + apple Snk ( AM): granola bar (sweet and salty)  L ( PM): air fried chicken + purple cabbage + corn muffin + yam souffle Snk ( PM): 1/2 strawberry shortcake D ( PM): instant mashed potatoes + beef + gravy Snk ( PM): chips + salsa Beverages: 2% milk, half and half tea, juice, water  Usual physical activity: none stated  Estimated energy needs: 1600-1800 calories 180-200 g carbohydrates 135-150 g protein 50-56 g fat  Progress Towards Goal(s):  In progress.   Nutritional Diagnosis:  NB-1.1 Food and nutrition-related knowledge deficit As  related to prediabetes.  As evidenced by pt verbalizes incomplete information.    Intervention:  Nutrition education and counseling. Pt was educated and counseled on prediabetes, fiber intake, and importance of increasing physical activity. Pt was in agreement with goals listed.  Goals: - Increase physical activity. Use [employer] Training App.  - Increase fiber intake by having non-starchy vegetables with lunch and dinner.  - Have balanced meals 3 times a day.  - Balance snack options with carbohydrate and protein.   Teaching Method Utilized:  Visual Auditory Hands on  Handouts given during visit include:  My Plate for Diabetes  Barriers to learning/adherence to lifestyle change: work-life balance  Demonstrated degree of understanding via:  Teach Back   Monitoring/Evaluation:  Dietary intake, exercise, and body weight in 1 month(s).

## 2018-09-05 ENCOUNTER — Ambulatory Visit: Payer: No Typology Code available for payment source | Admitting: Registered"

## 2018-09-20 MED FILL — POTASSIUM CHLORIDE CRYS ER: 20 | 90 days supply | Qty: 90 | Fill #0

## 2018-09-20 MED FILL — ATENOLOL 25 MG TABLET: 25 | 90 days supply | Qty: 45 | Fill #4

## 2018-10-02 MED FILL — HYDROCHLOROTHIAZIDE 12.5 MG: 12.5 | 90 days supply | Qty: 90 | Fill #0

## 2018-10-22 MED FILL — VIT D2 1.25 MG (50,000 UNIT: 1.25 MG | 84 days supply | Qty: 12 | Fill #0

## 2018-11-28 ENCOUNTER — Other Ambulatory Visit: Payer: Self-pay | Admitting: Obstetrics and Gynecology

## 2018-11-28 DIAGNOSIS — Z1231 Encounter for screening mammogram for malignant neoplasm of breast: Secondary | ICD-10-CM

## 2018-12-06 MED FILL — ATENOLOL 25 MG TABLET: 25 | 90 days supply | Qty: 45 | Fill #0

## 2018-12-19 ENCOUNTER — Other Ambulatory Visit: Payer: Self-pay | Admitting: Obstetrics and Gynecology

## 2018-12-19 DIAGNOSIS — R921 Mammographic calcification found on diagnostic imaging of breast: Secondary | ICD-10-CM

## 2018-12-24 ENCOUNTER — Ambulatory Visit: Payer: No Typology Code available for payment source

## 2018-12-27 ENCOUNTER — Ambulatory Visit
Admission: RE | Admit: 2018-12-27 | Discharge: 2018-12-27 | Disposition: A | Discharge status: Home / Self Care | Payer: No Typology Code available for payment source | Source: Ambulatory Visit | Attending: Obstetrics and Gynecology | Admitting: Obstetrics and Gynecology

## 2018-12-27 ENCOUNTER — Other Ambulatory Visit: Payer: Self-pay

## 2018-12-27 DIAGNOSIS — R921 Mammographic calcification found on diagnostic imaging of breast: Secondary | ICD-10-CM

## 2018-12-27 MED FILL — HYDROCHLOROTHIAZIDE 12.5 MG: 12.5 | 90 days supply | Qty: 90 | Fill #1

## 2018-12-31 ENCOUNTER — Ambulatory Visit: Payer: No Typology Code available for payment source

## 2019-01-01 MED FILL — metFORMIN HCL ER 500 MG TB2: 500 | 30 days supply | Qty: 30 | Fill #0

## 2019-01-30 MED FILL — metFORMIN HCL ER 500 MG TB2: 500 | 30 days supply | Qty: 30 | Fill #1

## 2019-02-12 ENCOUNTER — Ambulatory Visit: Payer: No Typology Code available for payment source

## 2019-03-04 MED FILL — metFORMIN HCL ER 500 MG TB2: 500 | 30 days supply | Qty: 30 | Fill #2

## 2019-03-13 MED FILL — HYDROCHLOROTHIAZIDE 12.5 MG: 12.5 | 90 days supply | Qty: 90 | Fill #2

## 2019-03-13 MED FILL — ATENOLOL 25 MG TABLET: 25 | 90 days supply | Qty: 45 | Fill #1

## 2019-03-31 MED FILL — METFORMIN HCL ER 500 MG TB2: 500 | 30 days supply | Qty: 30 | Fill #3

## 2019-04-29 MED FILL — metFORMIN HCL ER 500 MG TB2: 500 | 30 days supply | Qty: 30 | Fill #4

## 2019-05-16 MED FILL — POTASSIUM CHLORIDE CRYS ER: 20 | 90 days supply | Qty: 90 | Fill #0

## 2019-05-19 MED FILL — VIT D2 1.25 MG (50,000 UNIT: 1.25 MG | 28 days supply | Qty: 4 | Fill #0

## 2019-06-02 MED FILL — metFORMIN HCL ER 500 MG TB2: 500 | 30 days supply | Qty: 30 | Fill #5

## 2019-06-03 MED FILL — VIT D2 1.25 MG (50,000 UNIT: 1.25 MG | 28 days supply | Qty: 4 | Fill #0

## 2019-06-03 MED FILL — VIT D2 1.25 MG (50,"000 UNIT: 1.25 MG | 28 days supply | Qty: 4 | Fill #0

## 2019-06-19 MED FILL — ATENOLOL 25 MG TABLET: 25 | 90 days supply | Qty: 45 | Fill #2

## 2019-06-19 MED FILL — HYDROCHLOROTHIAZIDE 12.5 MG: 12.5 | 90 days supply | Qty: 90 | Fill #3

## 2019-08-07 MED FILL — POTASSIUM CHLORIDE CRYS ER: 20 | 90 days supply | Qty: 90 | Fill #1

## 2019-08-22 MED FILL — metFORMIN HCL ER 500 MG TB2: 500 | 30 days supply | Qty: 30 | Fill #0

## 2019-09-05 MED FILL — AMOXICILLIN 500 MG CAPSULE: 500 | 7 days supply | Qty: 14 | Fill #0

## 2019-09-05 MED FILL — FLUTICASONE PROP 50 MCG SPR: 50 | 60 days supply | Qty: 16 | Fill #0

## 2019-09-17 MED FILL — ATENOLOL 25 MG TABLET: 25 | 90 days supply | Qty: 45 | Fill #3

## 2019-09-17 MED FILL — metFORMIN HCL ER 500 MG TB2: 500 | 90 days supply | Qty: 90 | Fill #1

## 2019-09-17 MED FILL — HYDROCHLOROTHIAZIDE 12.5 MG: 12.5 | 90 days supply | Qty: 90 | Fill #0

## 2019-11-24 MED FILL — POTASSIUM CHLORIDE CRYS ER: 20 | 90 days supply | Qty: 90 | Fill #0

## 2019-11-24 MED FILL — HYDROCHLOROTHIAZIDE 12.5 MG: 12.5 | 90 days supply | Qty: 90 | Fill #0

## 2019-11-26 MED FILL — VIT D2 1.25 MG (50,000 UNIT: 1.25 MG | 56 days supply | Qty: 8 | Fill #0

## 2019-12-12 MED FILL — ATENOLOL 25 MG TABLET: 25 | 90 days supply | Qty: 45 | Fill #0

## 2019-12-12 MED FILL — metFORMIN HCL ER 500 MG TB2: 500 | 60 days supply | Qty: 60 | Fill #2

## 2019-12-15 ENCOUNTER — Other Ambulatory Visit: Payer: Self-pay | Admitting: Obstetrics and Gynecology

## 2019-12-15 DIAGNOSIS — R921 Mammographic calcification found on diagnostic imaging of breast: Secondary | ICD-10-CM

## 2020-01-07 ENCOUNTER — Ambulatory Visit
Admission: RE | Admit: 2020-01-07 | Discharge: 2020-01-07 | Disposition: A | Discharge status: Home / Self Care | Payer: No Typology Code available for payment source | Source: Ambulatory Visit | Attending: Obstetrics and Gynecology | Admitting: Obstetrics and Gynecology

## 2020-01-07 ENCOUNTER — Other Ambulatory Visit: Payer: Self-pay

## 2020-01-07 DIAGNOSIS — R921 Mammographic calcification found on diagnostic imaging of breast: Secondary | ICD-10-CM

## 2020-02-09 MED FILL — metFORMIN HCL ER 500 MG TB2: 500 | 90 days supply | Qty: 90 | Fill #0

## 2020-03-08 MED FILL — ATENOLOL 25 MG TABLET: 25 | 90 days supply | Qty: 45 | Fill #1

## 2020-03-08 MED FILL — HYDROCHLOROTHIAZIDE 12.5 MG: 12.5 | 90 days supply | Qty: 90 | Fill #1

## 2020-05-27 MED FILL — HYDROCHLOROTHIAZIDE 12.5 MG: 12.5 | 90 days supply | Qty: 90 | Fill #2

## 2020-05-27 MED FILL — ATENOLOL 25 MG TABLET: 25 | 90 days supply | Qty: 45 | Fill #2

## 2020-07-13 ENCOUNTER — Other Ambulatory Visit (HOSPITAL_COMMUNITY): Payer: Self-pay | Admitting: Internal Medicine

## 2020-07-13 MED FILL — tiZANidine HCL 4 MG TABS: 4 | 7 days supply | Qty: 21 | Fill #0

## 2020-07-13 MED FILL — PANTOPRAZOLE SOD DR 40 MG T: 40 | 30 days supply | Qty: 30 | Fill #0

## 2020-07-19 ENCOUNTER — Ambulatory Visit
Admission: RE | Admit: 2020-07-19 | Discharge: 2020-07-19 | Disposition: A | Discharge status: Home / Self Care | Payer: No Typology Code available for payment source | Source: Ambulatory Visit | Attending: Internal Medicine | Admitting: Internal Medicine

## 2020-07-19 ENCOUNTER — Other Ambulatory Visit: Payer: Self-pay | Admitting: Internal Medicine

## 2020-07-19 DIAGNOSIS — M542 Cervicalgia: Secondary | ICD-10-CM

## 2020-08-09 MED FILL — PANTOPRAZOLE SOD DR 40 MG T: 40 | 30 days supply | Qty: 30 | Fill #1

## 2020-08-30 MED FILL — POTASSIUM CHLORIDE CRYS ER: 20 | 90 days supply | Qty: 90 | Fill #1

## 2020-08-31 ENCOUNTER — Other Ambulatory Visit (HOSPITAL_COMMUNITY): Payer: Self-pay | Admitting: Family

## 2020-08-31 MED FILL — AZITHROMYCIN 250 MG TABLET: 250 | 5 days supply | Qty: 6 | Fill #0

## 2020-09-03 ENCOUNTER — Other Ambulatory Visit (HOSPITAL_COMMUNITY): Payer: Self-pay | Admitting: Internal Medicine

## 2020-09-03 MED FILL — ATENOLOL 25 MG TABLET: 25 | 90 days supply | Qty: 45 | Fill #3

## 2020-09-03 MED FILL — HYDROCHLOROTHIAZIDE 12.5 MG: 12.5 | 90 days supply | Qty: 90 | Fill #3

## 2020-09-03 MED FILL — AMOX-CLAV 875-125 MG TABLET: 875-125 | 7 days supply | Qty: 14 | Fill #0

## 2020-09-07 ENCOUNTER — Other Ambulatory Visit (HOSPITAL_COMMUNITY): Payer: Self-pay | Admitting: Internal Medicine

## 2020-09-07 MED FILL — HYDROCODONE-CHLORPHEN ER SU: 10-8 | 10 days supply | Qty: 100 | Fill #0

## 2020-11-03 ENCOUNTER — Other Ambulatory Visit (HOSPITAL_COMMUNITY): Payer: Self-pay | Admitting: Obstetrics and Gynecology

## 2020-11-03 MED FILL — FLUCONAZOLE 150 MG TABS: 150 | 7 days supply | Qty: 3 | Fill #0

## 2020-11-26 ENCOUNTER — Other Ambulatory Visit (HOSPITAL_COMMUNITY): Payer: Self-pay | Admitting: Internal Medicine

## 2020-11-26 MED FILL — ATORVASTATIN 10 MG TABLET: 10 | 90 days supply | Qty: 90 | Fill #0

## 2020-11-26 MED FILL — HYDROCHLOROTHIAZIDE 12.5 MG: 12.5 | 90 days supply | Qty: 90 | Fill #0

## 2020-11-26 MED FILL — POTASSIUM CHLORIDE CRYS ER: 20 | 90 days supply | Qty: 90 | Fill #0

## 2020-11-26 MED FILL — metFORMIN HCL ER 500 MG TB2: 500 | 90 days supply | Qty: 90 | Fill #0

## 2020-11-26 MED FILL — ATENOLOL 25 MG TABLET: 25 | 90 days supply | Qty: 45 | Fill #0

## 2020-11-29 ENCOUNTER — Other Ambulatory Visit (HOSPITAL_COMMUNITY): Payer: Self-pay | Admitting: Internal Medicine

## 2020-12-06 ENCOUNTER — Other Ambulatory Visit: Payer: Self-pay | Admitting: Obstetrics and Gynecology

## 2020-12-06 MED FILL — HYDROCHLOROTHIAZIDE 12.5 MG: 12.5 | 90 days supply | Qty: 90 | Fill #0

## 2020-12-06 MED FILL — ATENOLOL 25 MG TABLET: 25 | 90 days supply | Qty: 45 | Fill #0

## 2020-12-09 ENCOUNTER — Other Ambulatory Visit: Payer: Self-pay | Admitting: Internal Medicine

## 2020-12-09 DIAGNOSIS — R2231 Localized swelling, mass and lump, right upper limb: Secondary | ICD-10-CM

## 2021-01-07 MED FILL — Atorvastatin Calcium Tab 10 MG (Base Equivalent): ORAL | Qty: 90 | Fill #0 | Status: CN

## 2021-01-08 ENCOUNTER — Other Ambulatory Visit (HOSPITAL_COMMUNITY): Payer: Self-pay

## 2021-01-10 ENCOUNTER — Ambulatory Visit
Admission: RE | Admit: 2021-01-10 | Discharge: 2021-01-10 | Disposition: A | Discharge status: Home / Self Care | Payer: No Typology Code available for payment source | Source: Ambulatory Visit | Attending: Internal Medicine | Admitting: Internal Medicine

## 2021-01-10 ENCOUNTER — Other Ambulatory Visit: Payer: Self-pay

## 2021-01-10 ENCOUNTER — Other Ambulatory Visit: Payer: Self-pay | Admitting: Obstetrics and Gynecology

## 2021-01-10 ENCOUNTER — Other Ambulatory Visit (HOSPITAL_COMMUNITY): Payer: Self-pay

## 2021-01-10 DIAGNOSIS — R2231 Localized swelling, mass and lump, right upper limb: Secondary | ICD-10-CM

## 2021-01-10 MED FILL — Atorvastatin Calcium Tab 10 MG (Base Equivalent): ORAL | 90 days supply | Qty: 90 | Fill #0 | Status: CN

## 2021-01-11 ENCOUNTER — Other Ambulatory Visit (HOSPITAL_COMMUNITY): Payer: Self-pay

## 2021-01-12 ENCOUNTER — Other Ambulatory Visit (HOSPITAL_COMMUNITY): Payer: Self-pay

## 2021-01-13 ENCOUNTER — Other Ambulatory Visit (HOSPITAL_COMMUNITY): Payer: Self-pay

## 2021-01-14 ENCOUNTER — Other Ambulatory Visit (HOSPITAL_COMMUNITY): Payer: Self-pay

## 2021-01-14 MED ORDER — ATORVASTATIN CALCIUM 20 MG PO TABS
20.0000 mg | ORAL_TABLET | Freq: Every day | ORAL | 1 refills | Status: DC
  Filled 2021-01-14: qty 90, 90d supply, fill #0
  Filled 2021-04-14: qty 90, 90d supply, fill #1

## 2021-02-21 ENCOUNTER — Other Ambulatory Visit (HOSPITAL_COMMUNITY): Payer: Self-pay

## 2021-02-21 ENCOUNTER — Other Ambulatory Visit (HOSPITAL_BASED_OUTPATIENT_CLINIC_OR_DEPARTMENT_OTHER): Payer: Self-pay

## 2021-02-21 MED FILL — Potassium Chloride Microencapsulated Crys ER Tab 20 mEq: ORAL | 90 days supply | Qty: 90 | Fill #0 | Status: AC

## 2021-03-01 ENCOUNTER — Other Ambulatory Visit (HOSPITAL_COMMUNITY): Payer: Self-pay

## 2021-03-01 MED FILL — Atenolol Tab 25 MG: ORAL | 90 days supply | Qty: 45 | Fill #0 | Status: AC

## 2021-03-16 ENCOUNTER — Other Ambulatory Visit (HOSPITAL_COMMUNITY): Payer: Self-pay

## 2021-03-16 MED FILL — Hydrochlorothiazide Tab 12.5 MG: ORAL | 90 days supply | Qty: 90 | Fill #0 | Status: AC

## 2021-03-28 ENCOUNTER — Other Ambulatory Visit (HOSPITAL_COMMUNITY): Payer: Self-pay

## 2021-03-29 ENCOUNTER — Other Ambulatory Visit (HOSPITAL_COMMUNITY): Payer: Self-pay

## 2021-04-14 ENCOUNTER — Other Ambulatory Visit (HOSPITAL_COMMUNITY): Payer: Self-pay

## 2021-04-15 ENCOUNTER — Other Ambulatory Visit (HOSPITAL_COMMUNITY): Payer: Self-pay

## 2021-04-15 MED ORDER — AMOXICILLIN-POT CLAVULANATE 875-125 MG PO TABS
ORAL_TABLET | ORAL | 0 refills | Status: AC
  Filled 2021-04-15: qty 14, 7d supply, fill #0

## 2021-04-19 ENCOUNTER — Other Ambulatory Visit (HOSPITAL_COMMUNITY): Payer: Self-pay

## 2021-04-19 MED FILL — Metformin HCl Tab ER 24HR 500 MG: ORAL | 90 days supply | Qty: 90 | Fill #0 | Status: AC

## 2021-05-27 ENCOUNTER — Other Ambulatory Visit (HOSPITAL_COMMUNITY): Payer: Self-pay

## 2021-05-27 MED ORDER — ATORVASTATIN CALCIUM 20 MG PO TABS
ORAL_TABLET | ORAL | 2 refills | Status: DC
  Filled 2021-05-27 – 2021-07-04 (×2): qty 90, 90d supply, fill #0
  Filled 2021-10-02: qty 90, 90d supply, fill #1
  Filled 2022-02-27: qty 90, 90d supply, fill #2

## 2021-05-31 ENCOUNTER — Other Ambulatory Visit (HOSPITAL_COMMUNITY): Payer: Self-pay

## 2021-05-31 MED FILL — Atenolol Tab 25 MG: ORAL | 90 days supply | Qty: 45 | Fill #1 | Status: AC

## 2021-05-31 MED FILL — Hydrochlorothiazide Tab 12.5 MG: ORAL | 90 days supply | Qty: 90 | Fill #1 | Status: AC

## 2021-07-04 ENCOUNTER — Other Ambulatory Visit (HOSPITAL_COMMUNITY): Payer: Self-pay

## 2021-07-21 MED FILL — Metformin HCl Tab ER 24HR 500 MG: ORAL | 90 days supply | Qty: 90 | Fill #1 | Status: AC

## 2021-07-22 ENCOUNTER — Other Ambulatory Visit (HOSPITAL_COMMUNITY): Payer: Self-pay

## 2021-07-27 ENCOUNTER — Other Ambulatory Visit (HOSPITAL_COMMUNITY): Payer: Self-pay

## 2021-07-27 MED ORDER — CHLORHEXIDINE GLUCONATE 0.12 % MT SOLN
OROMUCOSAL | 3 refills | Status: AC
  Filled 2021-07-27: qty 473, 15d supply, fill #0

## 2021-07-27 MED ORDER — DOXYCYCLINE HYCLATE 20 MG PO TABS
20.0000 mg | ORAL_TABLET | Freq: Two times a day (BID) | ORAL | 5 refills | Status: AC
  Filled 2021-07-27: qty 60, 30d supply, fill #0

## 2021-07-28 ENCOUNTER — Other Ambulatory Visit (HOSPITAL_COMMUNITY): Payer: Self-pay

## 2021-08-15 ENCOUNTER — Other Ambulatory Visit (HOSPITAL_COMMUNITY): Payer: Self-pay

## 2021-08-15 ENCOUNTER — Telehealth: Payer: No Typology Code available for payment source | Admitting: Family Medicine

## 2021-08-15 DIAGNOSIS — J329 Chronic sinusitis, unspecified: Secondary | ICD-10-CM | POA: Diagnosis not present

## 2021-08-15 DIAGNOSIS — J029 Acute pharyngitis, unspecified: Secondary | ICD-10-CM | POA: Diagnosis not present

## 2021-08-15 DIAGNOSIS — B9789 Other viral agents as the cause of diseases classified elsewhere: Secondary | ICD-10-CM

## 2021-08-15 MED ORDER — IPRATROPIUM BROMIDE 0.03 % NA SOLN
2.0000 | Freq: Two times a day (BID) | NASAL | 12 refills | Status: AC
  Filled 2021-08-15: qty 30, 25d supply, fill #0

## 2021-08-15 MED ORDER — IBUPROFEN 600 MG PO TABS
600.0000 mg | ORAL_TABLET | Freq: Three times a day (TID) | ORAL | 0 refills | Status: AC | PRN
  Filled 2021-08-15: qty 30, 10d supply, fill #0

## 2021-08-15 NOTE — Progress Notes (Signed)
E-Visit for Sinus Problems  We are sorry that you are not feeling well.  Here is how we plan to help!  Based on what you have shared with me it looks like you have sinusitis.  Sinusitis is inflammation and infection in the sinus cavities of the head.  Based on your presentation I believe you most likely have Acute Viral Sinusitis.This is an infection most likely caused by a virus. There is not specific treatment for viral sinusitis other than to help you with the symptoms until the infection runs its course.  You may use an oral decongestant such as Mucinex D or if you have glaucoma or high blood pressure use plain Mucinex. Saline nasal spray help and can safely be used as often as needed for congestion, I have prescribed: Ipratropium Bromide nasal spray 0.03% 2 sprays in eah nostril 2-3 times a day  Some authorities believe that zinc sprays or the use of Echinacea may shorten the course of your symptoms.  Sinus infections are not as easily transmitted as other respiratory infection, however we still recommend that you avoid close contact with loved ones, especially the very young and elderly.  Remember to wash your hands thoroughly throughout the day as this is the number one way to prevent the spread of infection!   Providers prescribe antibiotics to treat infections caused by bacteria. Antibiotics are very powerful in treating bacterial infections when they are used properly.  To maintain their effectiveness, they should be used only when necessary.  Overuse of antibiotics has resulted in the development of super bugs that are resistant to treatment!  If you are not feeling better in 3-5 days please follow up with PCP.    Home Care: Only take medications as instructed by your medical team. Do not take these medications with alcohol. A steam or ultrasonic humidifier can help congestion.  You can place a towel over your head and breathe in the steam from hot water coming from a faucet. Avoid close  contacts especially the very young and the elderly. Cover your mouth when you cough or sneeze. Always remember to wash your hands.  Get Help Right Away If: You develop worsening fever or sinus pain. You develop a severe head ache or visual changes. Your symptoms persist after you have completed your treatment plan.  Make sure you Understand these instructions. Will watch your condition. Will get help right away if you are not doing well or get worse.   Thank you for choosing an e-visit.  Your e-visit answers were reviewed by a board certified advanced clinical practitioner to complete your personal care plan. Depending upon the condition, your plan could have included both over the counter or prescription medications.  Please review your pharmacy choice. Make sure the pharmacy is open so you can pick up prescription now. If there is a problem, you may contact your provider through [employer] and have the prescription routed to another pharmacy.  Your safety is important to [place]. If you have drug allergies check your prescription carefully.   For the next 24 hours you can use MyChart to ask questions about today's visit, request a non-urgent call back, or ask for a work or school excuse. You will get an email in the next two days asking about your experience. I hope that your e-visit has been valuable and will speed your recovery.     I provided 5 minutes of non face-to-face time during this encounter for chart review, medication and order placement,  as well as and documentation.

## 2021-08-16 ENCOUNTER — Other Ambulatory Visit (HOSPITAL_COMMUNITY): Payer: Self-pay

## 2021-08-16 MED ORDER — AMOXICILLIN-POT CLAVULANATE 875-125 MG PO TABS
ORAL_TABLET | ORAL | 0 refills | Status: AC
  Filled 2021-08-16: qty 14, 7d supply, fill #0

## 2021-08-19 ENCOUNTER — Other Ambulatory Visit (HOSPITAL_COMMUNITY): Payer: Self-pay

## 2021-08-19 MED ORDER — FLUCONAZOLE 150 MG PO TABS
150.0000 mg | ORAL_TABLET | Freq: Every day | ORAL | 0 refills | Status: AC
  Filled 2021-08-19: qty 1, 1d supply, fill #0

## 2021-08-24 ENCOUNTER — Other Ambulatory Visit (HOSPITAL_COMMUNITY): Payer: Self-pay

## 2021-08-24 MED ORDER — FLUCONAZOLE 150 MG PO TABS
ORAL_TABLET | ORAL | 0 refills | Status: AC
  Filled 2021-08-24: qty 1, 1d supply, fill #0

## 2021-08-24 MED ORDER — FLUCONAZOLE 150 MG PO TABS: ORAL_TABLET | ORAL | 0 refills | Status: AC

## 2021-08-25 ENCOUNTER — Other Ambulatory Visit (HOSPITAL_COMMUNITY): Payer: Self-pay

## 2021-08-25 MED FILL — Atenolol Tab 25 MG: ORAL | 90 days supply | Qty: 45 | Fill #2 | Status: AC

## 2021-08-25 MED FILL — Hydrochlorothiazide Tab 12.5 MG: ORAL | 90 days supply | Qty: 90 | Fill #2 | Status: AC

## 2021-08-25 MED FILL — Potassium Chloride Microencapsulated Crys ER Tab 20 mEq: ORAL | 90 days supply | Qty: 90 | Fill #1 | Status: AC

## 2021-09-05 ENCOUNTER — Other Ambulatory Visit (HOSPITAL_COMMUNITY): Payer: Self-pay

## 2021-09-05 MED ORDER — PREDNISONE 10 MG (21) PO TBPK
ORAL_TABLET | ORAL | 0 refills | Status: AC
  Filled 2021-09-05: qty 21, 6d supply, fill #0

## 2021-09-05 MED ORDER — MONTELUKAST SODIUM 10 MG PO TABS
10.0000 mg | ORAL_TABLET | Freq: Every day | ORAL | 1 refills | Status: DC
  Filled 2021-09-05: qty 30, 30d supply, fill #0
  Filled 2021-10-02: qty 30, 30d supply, fill #1

## 2021-09-06 ENCOUNTER — Other Ambulatory Visit (HOSPITAL_COMMUNITY): Payer: Self-pay

## 2021-09-20 ENCOUNTER — Other Ambulatory Visit (HOSPITAL_COMMUNITY): Payer: Self-pay

## 2021-09-20 MED ORDER — VITAMIN D (ERGOCALCIFEROL) 1.25 MG (50000 UNIT) PO CAPS
ORAL_CAPSULE | ORAL | 0 refills | Status: AC
  Filled 2021-09-20: qty 12, 84d supply, fill #0

## 2021-10-03 ENCOUNTER — Other Ambulatory Visit (HOSPITAL_COMMUNITY): Payer: Self-pay

## 2021-10-18 MED FILL — Metformin HCl Tab ER 24HR 500 MG: ORAL | 90 days supply | Qty: 90 | Fill #2 | Status: AC

## 2021-10-19 ENCOUNTER — Other Ambulatory Visit (HOSPITAL_COMMUNITY): Payer: Self-pay

## 2021-11-09 ENCOUNTER — Other Ambulatory Visit (HOSPITAL_COMMUNITY): Payer: Self-pay

## 2021-11-09 MED ORDER — MONTELUKAST SODIUM 10 MG PO TABS
10.0000 mg | ORAL_TABLET | Freq: Every day | ORAL | 1 refills | Status: DC
  Filled 2021-11-09: qty 30, 30d supply, fill #0
  Filled 2022-01-04: qty 30, 30d supply, fill #1

## 2021-11-09 MED ORDER — ATENOLOL 25 MG PO TABS
ORAL_TABLET | ORAL | 0 refills | Status: DC
  Filled 2021-11-09: qty 45, 90d supply, fill #0

## 2021-11-10 ENCOUNTER — Other Ambulatory Visit (HOSPITAL_COMMUNITY): Payer: Self-pay

## 2021-11-11 ENCOUNTER — Other Ambulatory Visit (HOSPITAL_COMMUNITY): Payer: Self-pay

## 2021-12-06 ENCOUNTER — Other Ambulatory Visit (HOSPITAL_COMMUNITY): Payer: Self-pay

## 2021-12-06 MED ORDER — HYDROCHLOROTHIAZIDE 12.5 MG PO TABS
ORAL_TABLET | ORAL | 0 refills | Status: DC
  Filled 2021-12-06: qty 90, 90d supply, fill #0

## 2021-12-07 ENCOUNTER — Other Ambulatory Visit (HOSPITAL_COMMUNITY): Payer: Self-pay

## 2022-01-04 ENCOUNTER — Other Ambulatory Visit (HOSPITAL_COMMUNITY): Payer: Self-pay

## 2022-01-05 ENCOUNTER — Other Ambulatory Visit (HOSPITAL_COMMUNITY): Payer: Self-pay

## 2022-01-05 MED ORDER — ATORVASTATIN CALCIUM 20 MG PO TABS
ORAL_TABLET | ORAL | 2 refills | Status: AC
  Filled 2022-01-05: qty 90, 90d supply, fill #0

## 2022-01-05 MED ORDER — HYDROCHLOROTHIAZIDE 12.5 MG PO TABS
ORAL_TABLET | ORAL | 4 refills | Status: AC
  Filled 2022-01-05 – 2022-02-27 (×2): qty 90, 90d supply, fill #0
  Filled 2022-05-11: qty 90, 90d supply, fill #1
  Filled 2022-08-17: qty 90, 90d supply, fill #2
  Filled 2022-11-21: qty 90, 90d supply, fill #3

## 2022-01-05 MED ORDER — METFORMIN HCL ER 500 MG PO TB24
ORAL_TABLET | ORAL | 4 refills | Status: DC
  Filled 2022-01-05 – 2022-01-23 (×2): qty 90, 90d supply, fill #0
  Filled 2022-04-17: qty 90, 90d supply, fill #1
  Filled 2022-07-25: qty 90, 90d supply, fill #2
  Filled 2022-10-17: qty 90, 90d supply, fill #3

## 2022-01-05 MED ORDER — MONTELUKAST SODIUM 10 MG PO TABS
ORAL_TABLET | ORAL | 4 refills | Status: DC
  Filled 2022-01-05 – 2022-06-13 (×2): qty 90, 90d supply, fill #0
  Filled 2022-11-08: qty 90, 90d supply, fill #1

## 2022-01-05 MED ORDER — ATENOLOL 25 MG PO TABS
ORAL_TABLET | ORAL | 3 refills | Status: DC
  Filled 2022-01-05 – 2022-02-20 (×2): qty 45, 90d supply, fill #0
  Filled 2022-05-11: qty 45, 90d supply, fill #1
  Filled 2022-08-17: qty 45, 90d supply, fill #2
  Filled 2022-11-16: qty 45, 90d supply, fill #3

## 2022-01-09 ENCOUNTER — Other Ambulatory Visit (HOSPITAL_COMMUNITY): Payer: Self-pay

## 2022-01-09 MED ORDER — VITAMIN D (ERGOCALCIFEROL) 1.25 MG (50000 UNIT) PO CAPS
ORAL_CAPSULE | ORAL | 0 refills | Status: AC
  Filled 2022-01-09: qty 8, 56d supply, fill #0

## 2022-01-12 ENCOUNTER — Other Ambulatory Visit (HOSPITAL_COMMUNITY): Payer: Self-pay

## 2022-01-24 ENCOUNTER — Other Ambulatory Visit (HOSPITAL_COMMUNITY): Payer: Self-pay

## 2022-02-20 ENCOUNTER — Other Ambulatory Visit (HOSPITAL_COMMUNITY): Payer: Self-pay

## 2022-02-22 ENCOUNTER — Other Ambulatory Visit (HOSPITAL_COMMUNITY): Payer: Self-pay

## 2022-02-27 ENCOUNTER — Other Ambulatory Visit (HOSPITAL_COMMUNITY): Payer: Self-pay

## 2022-03-01 ENCOUNTER — Other Ambulatory Visit (HOSPITAL_COMMUNITY): Payer: Self-pay

## 2022-03-15 ENCOUNTER — Other Ambulatory Visit (HOSPITAL_COMMUNITY): Payer: Self-pay

## 2022-03-16 ENCOUNTER — Other Ambulatory Visit (HOSPITAL_COMMUNITY): Payer: Self-pay

## 2022-03-16 MED ORDER — POTASSIUM CHLORIDE CRYS ER 20 MEQ PO TBCR
EXTENDED_RELEASE_TABLET | ORAL | 3 refills | Status: AC
  Filled 2022-03-16: qty 90, 90d supply, fill #0
  Filled 2022-10-17: qty 90, 90d supply, fill #1

## 2022-03-17 ENCOUNTER — Other Ambulatory Visit (HOSPITAL_COMMUNITY): Payer: Self-pay

## 2022-03-24 ENCOUNTER — Other Ambulatory Visit (HOSPITAL_COMMUNITY): Payer: Self-pay

## 2022-03-24 MED ORDER — AMOXICILLIN 500 MG PO CAPS
ORAL_CAPSULE | ORAL | 0 refills | Status: AC
  Filled 2022-03-24: qty 14, 7d supply, fill #0

## 2022-03-28 ENCOUNTER — Other Ambulatory Visit (HOSPITAL_COMMUNITY): Payer: Self-pay

## 2022-04-18 ENCOUNTER — Other Ambulatory Visit (HOSPITAL_COMMUNITY): Payer: Self-pay

## 2022-05-02 ENCOUNTER — Other Ambulatory Visit (HOSPITAL_COMMUNITY): Payer: Self-pay

## 2022-05-11 ENCOUNTER — Other Ambulatory Visit (HOSPITAL_COMMUNITY): Payer: Self-pay

## 2022-05-11 ENCOUNTER — Other Ambulatory Visit: Payer: Self-pay | Admitting: Obstetrics and Gynecology

## 2022-05-11 DIAGNOSIS — Z1231 Encounter for screening mammogram for malignant neoplasm of breast: Secondary | ICD-10-CM

## 2022-05-23 ENCOUNTER — Other Ambulatory Visit (HOSPITAL_COMMUNITY): Payer: Self-pay

## 2022-05-23 MED ORDER — AMOXICILLIN-POT CLAVULANATE 875-125 MG PO TABS
ORAL_TABLET | ORAL | 0 refills | Status: AC
  Filled 2022-05-23: qty 20, 10d supply, fill #0

## 2022-05-31 ENCOUNTER — Ambulatory Visit
Admission: RE | Admit: 2022-05-31 | Discharge: 2022-05-31 | Disposition: A | Discharge status: Home / Self Care | Payer: No Typology Code available for payment source | Source: Ambulatory Visit | Attending: Obstetrics and Gynecology | Admitting: Obstetrics and Gynecology

## 2022-05-31 ENCOUNTER — Other Ambulatory Visit (HOSPITAL_COMMUNITY): Payer: Self-pay

## 2022-05-31 DIAGNOSIS — Z1231 Encounter for screening mammogram for malignant neoplasm of breast: Secondary | ICD-10-CM

## 2022-05-31 MED ORDER — FLUCONAZOLE 150 MG PO TABS
ORAL_TABLET | ORAL | 0 refills | Status: AC
  Filled 2022-05-31: qty 1, 7d supply, fill #0

## 2022-06-07 ENCOUNTER — Other Ambulatory Visit (HOSPITAL_COMMUNITY): Payer: Self-pay

## 2022-06-07 MED ORDER — FLUCONAZOLE 150 MG PO TABS
150.0000 mg | ORAL_TABLET | ORAL | 0 refills | Status: AC
  Filled 2022-06-07: qty 2, 8d supply, fill #0

## 2022-06-08 ENCOUNTER — Other Ambulatory Visit (HOSPITAL_COMMUNITY): Payer: Self-pay

## 2022-06-14 ENCOUNTER — Other Ambulatory Visit (HOSPITAL_COMMUNITY): Payer: Self-pay

## 2022-06-17 ENCOUNTER — Other Ambulatory Visit (HOSPITAL_COMMUNITY): Payer: Self-pay

## 2022-06-30 ENCOUNTER — Other Ambulatory Visit (HOSPITAL_COMMUNITY): Payer: Self-pay

## 2022-07-03 ENCOUNTER — Other Ambulatory Visit (HOSPITAL_COMMUNITY): Payer: Self-pay

## 2022-07-03 MED ORDER — ATORVASTATIN CALCIUM 20 MG PO TABS
20.0000 mg | ORAL_TABLET | Freq: Every day | ORAL | 3 refills | Status: DC
  Filled 2022-07-03: qty 90, 90d supply, fill #0
  Filled 2022-11-21: qty 90, 90d supply, fill #1
  Filled 2023-04-20 – 2023-04-23 (×2): qty 90, 90d supply, fill #2

## 2022-07-07 ENCOUNTER — Other Ambulatory Visit (HOSPITAL_COMMUNITY): Payer: Self-pay

## 2022-07-07 MED ORDER — ATENOLOL 25 MG PO TABS
12.5000 mg | ORAL_TABLET | Freq: Every day | ORAL | 3 refills | Status: AC
  Filled 2022-07-07: qty 45, 90d supply, fill #0

## 2022-07-07 MED ORDER — HYDROCHLOROTHIAZIDE 12.5 MG PO TABS: 12.5000 mg | ORAL_TABLET | Freq: Every morning | ORAL | 4 refills | Status: AC

## 2022-07-07 MED ORDER — POTASSIUM CHLORIDE CRYS ER 20 MEQ PO TBCR
20.0000 meq | EXTENDED_RELEASE_TABLET | Freq: Every day | ORAL | 3 refills | Status: DC
  Filled 2022-07-07 – 2023-05-14 (×2): qty 90, 90d supply, fill #0

## 2022-07-07 MED ORDER — METFORMIN HCL ER 500 MG PO TB24
500.0000 mg | ORAL_TABLET | Freq: Every evening | ORAL | 3 refills | Status: AC
  Filled 2022-07-07: qty 90, 90d supply, fill #0

## 2022-07-07 MED ORDER — MONTELUKAST SODIUM 10 MG PO TABS
10.0000 mg | ORAL_TABLET | Freq: Every day | ORAL | 4 refills | Status: AC
  Filled 2022-07-07: qty 90, 90d supply, fill #0

## 2022-07-11 ENCOUNTER — Other Ambulatory Visit (HOSPITAL_COMMUNITY): Payer: Self-pay

## 2022-07-25 ENCOUNTER — Other Ambulatory Visit (HOSPITAL_COMMUNITY): Payer: Self-pay

## 2022-08-18 ENCOUNTER — Other Ambulatory Visit (HOSPITAL_COMMUNITY): Payer: Self-pay

## 2022-09-07 ENCOUNTER — Other Ambulatory Visit (HOSPITAL_COMMUNITY): Payer: Self-pay

## 2022-09-07 MED ORDER — FLUCONAZOLE 150 MG PO TABS
ORAL_TABLET | ORAL | 0 refills | Status: AC
  Filled 2022-09-07: qty 1, 1d supply, fill #0

## 2022-09-08 ENCOUNTER — Other Ambulatory Visit (HOSPITAL_COMMUNITY): Payer: Self-pay

## 2022-09-18 DIAGNOSIS — J069 Acute upper respiratory infection, unspecified: Secondary | ICD-10-CM | POA: Diagnosis not present

## 2022-09-20 DIAGNOSIS — J019 Acute sinusitis, unspecified: Secondary | ICD-10-CM | POA: Diagnosis not present

## 2022-09-20 DIAGNOSIS — E1169 Type 2 diabetes mellitus with other specified complication: Secondary | ICD-10-CM | POA: Diagnosis not present

## 2022-09-20 DIAGNOSIS — U071 COVID-19: Secondary | ICD-10-CM | POA: Diagnosis not present

## 2022-09-20 DIAGNOSIS — J210 Acute bronchiolitis due to respiratory syncytial virus: Secondary | ICD-10-CM | POA: Diagnosis not present

## 2022-09-20 DIAGNOSIS — I10 Essential (primary) hypertension: Secondary | ICD-10-CM | POA: Diagnosis not present

## 2023-01-15 DIAGNOSIS — J301 Allergic rhinitis due to pollen: Secondary | ICD-10-CM | POA: Diagnosis not present

## 2023-01-15 DIAGNOSIS — E1169 Type 2 diabetes mellitus with other specified complication: Secondary | ICD-10-CM | POA: Diagnosis not present

## 2023-01-15 DIAGNOSIS — E669 Obesity, unspecified: Secondary | ICD-10-CM | POA: Diagnosis not present

## 2023-01-15 DIAGNOSIS — Z0000 Encounter for general adult medical examination without abnormal findings: Secondary | ICD-10-CM | POA: Diagnosis not present

## 2023-01-15 DIAGNOSIS — E559 Vitamin D deficiency, unspecified: Secondary | ICD-10-CM | POA: Diagnosis not present

## 2023-01-15 DIAGNOSIS — I10 Essential (primary) hypertension: Secondary | ICD-10-CM | POA: Diagnosis not present

## 2023-01-15 DIAGNOSIS — Z90710 Acquired absence of both cervix and uterus: Secondary | ICD-10-CM | POA: Diagnosis not present

## 2023-01-16 ENCOUNTER — Other Ambulatory Visit (HOSPITAL_COMMUNITY): Payer: Self-pay

## 2023-01-16 MED ORDER — ERGOCALCIFEROL 1.25 MG (50000 UT) PO CAPS
50000.0000 [IU] | ORAL_CAPSULE | ORAL | 0 refills | Status: AC
  Filled 2023-01-16: qty 8, 56d supply, fill #0

## 2023-01-19 ENCOUNTER — Other Ambulatory Visit (HOSPITAL_COMMUNITY): Payer: Self-pay

## 2023-01-19 MED ORDER — GLUCOSE BLOOD VI STRP
ORAL_STRIP | 3 refills | Status: AC
  Filled 2023-01-19: qty 50, 50d supply, fill #0

## 2023-01-19 MED ORDER — ONETOUCH ULTRA 2 W/DEVICE KIT
PACK | 3 refills | Status: AC
  Filled 2023-01-19: qty 1, 30d supply, fill #0

## 2023-01-19 MED ORDER — FREESTYLE LANCETS MISC
3 refills | Status: AC
  Filled 2023-01-19: qty 100, 90d supply, fill #0

## 2023-01-22 ENCOUNTER — Other Ambulatory Visit (HOSPITAL_COMMUNITY): Payer: Self-pay

## 2023-01-23 ENCOUNTER — Other Ambulatory Visit (HOSPITAL_COMMUNITY): Payer: Self-pay

## 2023-01-23 MED ORDER — ATENOLOL 25 MG PO TABS
ORAL_TABLET | ORAL | 3 refills | Status: DC
  Filled 2023-01-23 – 2023-01-29 (×3): qty 45, 90d supply, fill #0
  Filled 2023-04-20: qty 45, 90d supply, fill #1
  Filled 2023-07-24: qty 45, 90d supply, fill #2
  Filled 2023-10-30: qty 45, 90d supply, fill #3

## 2023-01-23 MED ORDER — METFORMIN HCL ER 500 MG PO TB24
ORAL_TABLET | ORAL | 3 refills | Status: DC
  Filled 2023-01-23: qty 90, 90d supply, fill #0
  Filled 2023-04-20 – 2023-04-23 (×2): qty 90, 90d supply, fill #1
  Filled 2023-07-24: qty 90, 90d supply, fill #2
  Filled 2023-10-18: qty 90, 90d supply, fill #3

## 2023-01-29 ENCOUNTER — Other Ambulatory Visit (HOSPITAL_COMMUNITY): Payer: Self-pay

## 2023-03-06 ENCOUNTER — Other Ambulatory Visit (HOSPITAL_COMMUNITY): Payer: Self-pay

## 2023-03-08 ENCOUNTER — Other Ambulatory Visit (HOSPITAL_COMMUNITY): Payer: Self-pay

## 2023-03-10 ENCOUNTER — Other Ambulatory Visit (HOSPITAL_COMMUNITY): Payer: Self-pay

## 2023-03-14 ENCOUNTER — Other Ambulatory Visit (HOSPITAL_COMMUNITY): Payer: Self-pay

## 2023-03-14 MED ORDER — HYDROCHLOROTHIAZIDE 12.5 MG PO TABS
12.5000 mg | ORAL_TABLET | Freq: Every morning | ORAL | 3 refills | Status: DC
  Filled 2023-03-14: qty 90, 90d supply, fill #0
  Filled 2023-06-04: qty 90, 90d supply, fill #1
  Filled 2023-08-22: qty 90, 90d supply, fill #2
  Filled 2023-10-30 – 2023-11-22 (×2): qty 90, 90d supply, fill #3

## 2023-03-15 ENCOUNTER — Other Ambulatory Visit (HOSPITAL_COMMUNITY): Payer: Self-pay

## 2023-04-23 ENCOUNTER — Other Ambulatory Visit (HOSPITAL_COMMUNITY): Payer: Self-pay

## 2023-05-01 ENCOUNTER — Other Ambulatory Visit (HOSPITAL_COMMUNITY): Payer: Self-pay

## 2023-05-14 ENCOUNTER — Other Ambulatory Visit (HOSPITAL_COMMUNITY): Payer: Self-pay

## 2023-05-23 ENCOUNTER — Other Ambulatory Visit (HOSPITAL_COMMUNITY): Payer: Self-pay

## 2023-05-23 DIAGNOSIS — R059 Cough, unspecified: Secondary | ICD-10-CM | POA: Diagnosis not present

## 2023-05-23 DIAGNOSIS — J019 Acute sinusitis, unspecified: Secondary | ICD-10-CM | POA: Diagnosis not present

## 2023-05-23 MED ORDER — AMOXICILLIN-POT CLAVULANATE 875-125 MG PO TABS
1.0000 | ORAL_TABLET | Freq: Two times a day (BID) | ORAL | 0 refills | Status: AC
  Filled 2023-05-23: qty 20, 10d supply, fill #0

## 2023-05-23 MED ORDER — BENZONATATE 100 MG PO CAPS
100.0000 mg | ORAL_CAPSULE | Freq: Three times a day (TID) | ORAL | 0 refills | Status: AC | PRN
  Filled 2023-05-23: qty 30, 10d supply, fill #0

## 2023-05-25 ENCOUNTER — Other Ambulatory Visit: Payer: Self-pay | Admitting: Obstetrics and Gynecology

## 2023-05-25 DIAGNOSIS — Z1231 Encounter for screening mammogram for malignant neoplasm of breast: Secondary | ICD-10-CM

## 2023-05-28 ENCOUNTER — Other Ambulatory Visit (HOSPITAL_COMMUNITY): Payer: Self-pay

## 2023-05-29 ENCOUNTER — Other Ambulatory Visit (HOSPITAL_COMMUNITY): Payer: Self-pay

## 2023-05-29 MED ORDER — FLUCONAZOLE 150 MG PO TABS
ORAL_TABLET | ORAL | 0 refills | Status: AC
  Filled 2023-05-29: qty 2, 10d supply, fill #0

## 2023-06-04 ENCOUNTER — Other Ambulatory Visit (HOSPITAL_COMMUNITY): Payer: Self-pay

## 2023-06-04 MED ORDER — MONTELUKAST SODIUM 10 MG PO TABS
10.0000 mg | ORAL_TABLET | Freq: Every day | ORAL | 3 refills | Status: AC
  Filled 2023-06-04: qty 90, 90d supply, fill #0
  Filled 2023-09-13: qty 90, 90d supply, fill #1
  Filled 2023-12-14: qty 90, 90d supply, fill #2
  Filled 2024-03-07: qty 90, 90d supply, fill #3

## 2023-06-06 ENCOUNTER — Ambulatory Visit: Payer: 59

## 2023-06-21 ENCOUNTER — Ambulatory Visit
Admission: RE | Admit: 2023-06-21 | Discharge: 2023-06-21 | Disposition: A | Discharge status: Home / Self Care | Payer: 59 | Source: Ambulatory Visit | Attending: Obstetrics and Gynecology | Admitting: Obstetrics and Gynecology

## 2023-06-21 DIAGNOSIS — Z1231 Encounter for screening mammogram for malignant neoplasm of breast: Secondary | ICD-10-CM | POA: Diagnosis not present

## 2023-07-16 ENCOUNTER — Other Ambulatory Visit (HOSPITAL_COMMUNITY): Payer: Self-pay

## 2023-07-16 DIAGNOSIS — E1169 Type 2 diabetes mellitus with other specified complication: Secondary | ICD-10-CM | POA: Diagnosis not present

## 2023-07-16 DIAGNOSIS — J329 Chronic sinusitis, unspecified: Secondary | ICD-10-CM | POA: Diagnosis not present

## 2023-07-16 DIAGNOSIS — J302 Other seasonal allergic rhinitis: Secondary | ICD-10-CM | POA: Diagnosis not present

## 2023-07-16 DIAGNOSIS — L709 Acne, unspecified: Secondary | ICD-10-CM | POA: Diagnosis not present

## 2023-07-16 MED ORDER — MONTELUKAST SODIUM 10 MG PO TABS
10.0000 mg | ORAL_TABLET | Freq: Every day | ORAL | 11 refills | Status: DC
  Filled 2023-07-16 – 2024-06-19 (×2): qty 30, 30d supply, fill #0
  Filled 2024-07-15: qty 30, 30d supply, fill #1

## 2023-07-17 ENCOUNTER — Other Ambulatory Visit (HOSPITAL_COMMUNITY): Payer: Self-pay

## 2023-07-18 DIAGNOSIS — E119 Type 2 diabetes mellitus without complications: Secondary | ICD-10-CM | POA: Diagnosis not present

## 2023-07-25 ENCOUNTER — Other Ambulatory Visit (HOSPITAL_COMMUNITY): Payer: Self-pay

## 2023-08-28 ENCOUNTER — Other Ambulatory Visit (HOSPITAL_COMMUNITY): Payer: Self-pay

## 2023-08-30 ENCOUNTER — Other Ambulatory Visit (HOSPITAL_COMMUNITY): Payer: Self-pay

## 2023-08-30 MED ORDER — ATORVASTATIN CALCIUM 20 MG PO TABS
20.0000 mg | ORAL_TABLET | Freq: Every day | ORAL | 3 refills | Status: DC
  Filled 2023-08-30: qty 90, 90d supply, fill #0
  Filled 2024-01-15: qty 90, 90d supply, fill #1
  Filled 2024-06-05: qty 90, 90d supply, fill #2

## 2023-09-22 NOTE — Unmapped External Note (Signed)
 Eligibility tasks completed

## 2023-10-18 ENCOUNTER — Other Ambulatory Visit (HOSPITAL_COMMUNITY): Payer: Self-pay

## 2023-10-30 ENCOUNTER — Other Ambulatory Visit: Payer: Self-pay

## 2023-10-30 ENCOUNTER — Other Ambulatory Visit (HOSPITAL_COMMUNITY): Payer: Self-pay

## 2023-11-14 DIAGNOSIS — Z01419 Encounter for gynecological examination (general) (routine) without abnormal findings: Secondary | ICD-10-CM | POA: Diagnosis not present

## 2023-11-14 DIAGNOSIS — Z90710 Acquired absence of both cervix and uterus: Secondary | ICD-10-CM | POA: Diagnosis not present

## 2023-11-22 ENCOUNTER — Other Ambulatory Visit (HOSPITAL_COMMUNITY): Payer: Self-pay

## 2023-11-23 ENCOUNTER — Other Ambulatory Visit (HOSPITAL_COMMUNITY): Payer: Self-pay

## 2023-11-23 MED ORDER — POTASSIUM CHLORIDE CRYS ER 20 MEQ PO TBCR
20.0000 meq | EXTENDED_RELEASE_TABLET | Freq: Every day | ORAL | 3 refills | Status: AC
  Filled 2023-11-23: qty 90, 90d supply, fill #0
  Filled 2024-05-22 (×2): qty 90, 90d supply, fill #1

## 2023-12-14 ENCOUNTER — Other Ambulatory Visit (HOSPITAL_COMMUNITY): Payer: Self-pay

## 2023-12-15 ENCOUNTER — Other Ambulatory Visit (HOSPITAL_COMMUNITY): Payer: Self-pay

## 2024-01-15 ENCOUNTER — Other Ambulatory Visit (HOSPITAL_COMMUNITY): Payer: Self-pay

## 2024-01-16 ENCOUNTER — Other Ambulatory Visit (HOSPITAL_COMMUNITY): Payer: Self-pay

## 2024-01-16 MED ORDER — METFORMIN HCL ER 500 MG PO TB24
500.0000 mg | ORAL_TABLET | Freq: Every day | ORAL | 1 refills | Status: DC
  Filled 2024-01-16: qty 90, 90d supply, fill #0
  Filled 2024-05-01: qty 90, 90d supply, fill #1

## 2024-01-18 DIAGNOSIS — I10 Essential (primary) hypertension: Secondary | ICD-10-CM | POA: Diagnosis not present

## 2024-01-18 DIAGNOSIS — E1169 Type 2 diabetes mellitus with other specified complication: Secondary | ICD-10-CM | POA: Diagnosis not present

## 2024-01-18 DIAGNOSIS — J302 Other seasonal allergic rhinitis: Secondary | ICD-10-CM | POA: Diagnosis not present

## 2024-01-18 DIAGNOSIS — R718 Other abnormality of red blood cells: Secondary | ICD-10-CM | POA: Diagnosis not present

## 2024-01-18 DIAGNOSIS — D229 Melanocytic nevi, unspecified: Secondary | ICD-10-CM | POA: Diagnosis not present

## 2024-01-18 DIAGNOSIS — Z0000 Encounter for general adult medical examination without abnormal findings: Secondary | ICD-10-CM | POA: Diagnosis not present

## 2024-01-18 DIAGNOSIS — J329 Chronic sinusitis, unspecified: Secondary | ICD-10-CM | POA: Diagnosis not present

## 2024-01-30 ENCOUNTER — Other Ambulatory Visit (HOSPITAL_COMMUNITY): Payer: Self-pay

## 2024-01-30 MED ORDER — ATENOLOL 25 MG PO TABS
12.5000 mg | ORAL_TABLET | Freq: Every day | ORAL | 3 refills | Status: AC
  Filled 2024-01-30: qty 45, 90d supply, fill #0
  Filled 2024-04-14: qty 45, 90d supply, fill #1
  Filled 2024-07-15: qty 45, 90d supply, fill #2
  Filled 2024-10-23: qty 45, 90d supply, fill #3

## 2024-01-31 ENCOUNTER — Other Ambulatory Visit (HOSPITAL_COMMUNITY): Payer: Self-pay

## 2024-03-02 ENCOUNTER — Other Ambulatory Visit (HOSPITAL_COMMUNITY): Payer: Self-pay

## 2024-03-03 ENCOUNTER — Other Ambulatory Visit (HOSPITAL_COMMUNITY): Payer: Self-pay

## 2024-03-03 ENCOUNTER — Other Ambulatory Visit: Payer: Self-pay

## 2024-03-03 MED ORDER — HYDROCHLOROTHIAZIDE 12.5 MG PO TABS
12.5000 mg | ORAL_TABLET | Freq: Every morning | ORAL | 5 refills | Status: AC
  Filled 2024-03-03: qty 90, 90d supply, fill #0
  Filled 2024-05-22 (×2): qty 90, 90d supply, fill #1
  Filled 2024-08-19: qty 90, 90d supply, fill #2

## 2024-04-14 ENCOUNTER — Other Ambulatory Visit (HOSPITAL_COMMUNITY): Payer: Self-pay

## 2024-05-01 ENCOUNTER — Other Ambulatory Visit (HOSPITAL_COMMUNITY): Payer: Self-pay

## 2024-05-22 ENCOUNTER — Other Ambulatory Visit: Payer: Self-pay

## 2024-05-22 ENCOUNTER — Other Ambulatory Visit: Payer: Self-pay | Admitting: Obstetrics and Gynecology

## 2024-05-22 DIAGNOSIS — Z1231 Encounter for screening mammogram for malignant neoplasm of breast: Secondary | ICD-10-CM

## 2024-05-23 ENCOUNTER — Other Ambulatory Visit (HOSPITAL_COMMUNITY): Payer: Self-pay

## 2024-06-11 ENCOUNTER — Other Ambulatory Visit (HOSPITAL_COMMUNITY): Payer: Self-pay

## 2024-06-19 ENCOUNTER — Other Ambulatory Visit (HOSPITAL_COMMUNITY): Payer: Self-pay

## 2024-06-23 ENCOUNTER — Ambulatory Visit
Admission: RE | Admit: 2024-06-23 | Discharge: 2024-06-23 | Disposition: A | Discharge status: Home / Self Care | Source: Ambulatory Visit | Attending: Obstetrics and Gynecology | Admitting: Obstetrics and Gynecology

## 2024-06-23 ENCOUNTER — Other Ambulatory Visit (HOSPITAL_COMMUNITY): Payer: Self-pay | Admitting: Nurse Practitioner

## 2024-06-23 ENCOUNTER — Ambulatory Visit (HOSPITAL_COMMUNITY)
Admission: RE | Admit: 2024-06-23 | Discharge: 2024-06-23 | Disposition: A | Discharge status: Home / Self Care | Payer: Self-pay | Source: Ambulatory Visit | Attending: Nurse Practitioner | Admitting: Nurse Practitioner

## 2024-06-23 DIAGNOSIS — Z1231 Encounter for screening mammogram for malignant neoplasm of breast: Secondary | ICD-10-CM | POA: Diagnosis not present

## 2024-06-23 DIAGNOSIS — S838X1A Sprain of other specified parts of right knee, initial encounter: Secondary | ICD-10-CM | POA: Insufficient documentation

## 2024-06-23 DIAGNOSIS — M25561 Pain in right knee: Secondary | ICD-10-CM | POA: Diagnosis not present

## 2024-07-14 ENCOUNTER — Other Ambulatory Visit (HOSPITAL_COMMUNITY): Payer: Self-pay | Admitting: Nurse Practitioner

## 2024-07-14 ENCOUNTER — Ambulatory Visit (HOSPITAL_COMMUNITY)
Admission: RE | Admit: 2024-07-14 | Discharge: 2024-07-14 | Disposition: A | Discharge status: Home / Self Care | Payer: Self-pay | Source: Ambulatory Visit | Attending: Nurse Practitioner | Admitting: Nurse Practitioner

## 2024-07-14 DIAGNOSIS — S7001XA Contusion of right hip, initial encounter: Secondary | ICD-10-CM | POA: Insufficient documentation

## 2024-07-14 DIAGNOSIS — M25561 Pain in right knee: Secondary | ICD-10-CM | POA: Diagnosis not present

## 2024-07-14 DIAGNOSIS — M25551 Pain in right hip: Secondary | ICD-10-CM | POA: Diagnosis not present

## 2024-07-16 ENCOUNTER — Other Ambulatory Visit (HOSPITAL_COMMUNITY): Payer: Self-pay

## 2024-07-16 ENCOUNTER — Other Ambulatory Visit: Payer: Self-pay

## 2024-07-16 MED ORDER — MONTELUKAST SODIUM 10 MG PO TABS
10.0000 mg | ORAL_TABLET | Freq: Every day | ORAL | 5 refills | Status: AC
  Filled 2024-07-16: qty 90, 90d supply, fill #0
  Filled 2024-10-07: qty 90, 90d supply, fill #1

## 2024-07-22 ENCOUNTER — Other Ambulatory Visit (HOSPITAL_COMMUNITY): Payer: Self-pay

## 2024-07-22 DIAGNOSIS — E2839 Other primary ovarian failure: Secondary | ICD-10-CM | POA: Diagnosis not present

## 2024-07-22 DIAGNOSIS — M25551 Pain in right hip: Secondary | ICD-10-CM | POA: Diagnosis not present

## 2024-07-22 DIAGNOSIS — I10 Essential (primary) hypertension: Secondary | ICD-10-CM | POA: Diagnosis not present

## 2024-07-22 DIAGNOSIS — N951 Menopausal and female climacteric states: Secondary | ICD-10-CM | POA: Diagnosis not present

## 2024-07-22 DIAGNOSIS — W19XXXA Unspecified fall, initial encounter: Secondary | ICD-10-CM | POA: Diagnosis not present

## 2024-07-22 DIAGNOSIS — J302 Other seasonal allergic rhinitis: Secondary | ICD-10-CM | POA: Diagnosis not present

## 2024-07-22 MED ORDER — MONTELUKAST SODIUM 10 MG PO TABS
10.0000 mg | ORAL_TABLET | Freq: Every day | ORAL | 5 refills | Status: AC
  Filled 2024-07-22: qty 90, 90d supply, fill #0

## 2024-07-22 MED ORDER — IPRATROPIUM BROMIDE 0.06 % NA SOLN
NASAL | 5 refills | Status: AC
  Filled 2024-07-22: qty 15, 30d supply, fill #0

## 2024-07-23 ENCOUNTER — Other Ambulatory Visit (HOSPITAL_BASED_OUTPATIENT_CLINIC_OR_DEPARTMENT_OTHER): Payer: Self-pay | Admitting: Internal Medicine

## 2024-07-23 ENCOUNTER — Other Ambulatory Visit (HOSPITAL_COMMUNITY): Payer: Self-pay

## 2024-07-23 DIAGNOSIS — E2839 Other primary ovarian failure: Secondary | ICD-10-CM

## 2024-07-28 ENCOUNTER — Other Ambulatory Visit (HOSPITAL_COMMUNITY): Payer: Self-pay

## 2024-07-29 ENCOUNTER — Other Ambulatory Visit (HOSPITAL_COMMUNITY): Payer: Self-pay

## 2024-07-29 MED ORDER — METFORMIN HCL ER 500 MG PO TB24
500.0000 mg | ORAL_TABLET | Freq: Every day | ORAL | 1 refills | Status: AC
  Filled 2024-07-29: qty 90, 90d supply, fill #0
  Filled 2024-10-23: qty 90, 90d supply, fill #1

## 2024-09-01 DIAGNOSIS — E119 Type 2 diabetes mellitus without complications: Secondary | ICD-10-CM | POA: Diagnosis not present

## 2024-09-17 ENCOUNTER — Other Ambulatory Visit (HOSPITAL_COMMUNITY): Payer: Self-pay

## 2024-09-17 MED ORDER — OSELTAMIVIR PHOSPHATE 75 MG PO CAPS
75.0000 mg | ORAL_CAPSULE | Freq: Two times a day (BID) | ORAL | 0 refills | Status: AC
  Filled 2024-09-17: qty 10, 5d supply, fill #0

## 2024-09-17 MED ORDER — HYDROCODONE BIT-HOMATROP MBR 5-1.5 MG/5ML PO SOLN
5.0000 mL | Freq: Two times a day (BID) | ORAL | 0 refills | Status: AC
  Filled 2024-09-17: qty 50, 5d supply, fill #0

## 2024-09-17 MED ORDER — BENZONATATE 100 MG PO CAPS
100.0000 mg | ORAL_CAPSULE | Freq: Three times a day (TID) | ORAL | 0 refills | Status: AC
  Filled 2024-09-17 – 2024-09-23 (×2): qty 21, 7d supply, fill #0

## 2024-09-23 ENCOUNTER — Other Ambulatory Visit (HOSPITAL_COMMUNITY): Payer: Self-pay

## 2024-10-07 ENCOUNTER — Other Ambulatory Visit (HOSPITAL_COMMUNITY): Payer: Self-pay

## 2024-10-08 ENCOUNTER — Other Ambulatory Visit (HOSPITAL_COMMUNITY): Payer: Self-pay

## 2024-10-08 ENCOUNTER — Other Ambulatory Visit: Payer: Self-pay

## 2024-10-08 MED ORDER — ATORVASTATIN CALCIUM 20 MG PO TABS
20.0000 mg | ORAL_TABLET | Freq: Every day | ORAL | 3 refills | Status: AC
  Filled 2024-10-08: qty 90, 90d supply, fill #0

## 2024-10-15 ENCOUNTER — Other Ambulatory Visit (HOSPITAL_COMMUNITY): Payer: Self-pay

## 2024-10-22 ENCOUNTER — Other Ambulatory Visit (HOSPITAL_COMMUNITY): Payer: Self-pay | Admitting: Orthopedic Surgery

## 2024-10-22 DIAGNOSIS — M25561 Pain in right knee: Secondary | ICD-10-CM

## 2024-10-23 ENCOUNTER — Other Ambulatory Visit: Payer: Self-pay

## 2024-10-23 ENCOUNTER — Other Ambulatory Visit (HOSPITAL_COMMUNITY): Payer: Self-pay

## 2024-10-23 MED ORDER — DIAZEPAM 10 MG PO TABS
ORAL_TABLET | ORAL | 0 refills | Status: AC
  Filled 2024-10-23: qty 2, 1d supply, fill #0

## 2024-10-23 MED ORDER — TRETINOIN 0.025 % EX CREA
TOPICAL_CREAM | CUTANEOUS | 6 refills | Status: AC
  Filled 2024-10-23: qty 20, 30d supply, fill #0

## 2024-10-25 ENCOUNTER — Ambulatory Visit (HOSPITAL_COMMUNITY)
